# Patient Record
Sex: Male | Born: 1940 | Race: White | Hispanic: No | Marital: Married | Smoking: Former smoker
Health system: Southern US, Community
[De-identification: ages and names within clinical notes are randomized; demographics above are authoritative.]

## PROBLEM LIST (undated history)

## (undated) DIAGNOSIS — I219 Acute myocardial infarction, unspecified: Secondary | ICD-10-CM

## (undated) DIAGNOSIS — E785 Hyperlipidemia, unspecified: Secondary | ICD-10-CM

## (undated) DIAGNOSIS — I2510 Atherosclerotic heart disease of native coronary artery without angina pectoris: Secondary | ICD-10-CM

## (undated) HISTORY — DX: Atherosclerotic heart disease of native coronary artery without angina pectoris: I25.10

## (undated) HISTORY — DX: Acute myocardial infarction, unspecified: I21.9

## (undated) HISTORY — DX: Hyperlipidemia, unspecified: E78.5

---

## 2009-03-29 ENCOUNTER — Inpatient Hospital Stay (HOSPITAL_COMMUNITY): Admission: AD | Admit: 2009-03-29 | Discharge: 2009-04-08 | Payer: Self-pay | Admitting: Cardiovascular Disease

## 2009-03-29 ENCOUNTER — Ambulatory Visit: Payer: Self-pay | Admitting: Cardiology

## 2009-03-30 ENCOUNTER — Encounter: Payer: Self-pay | Admitting: Cardiology

## 2009-03-31 ENCOUNTER — Ambulatory Visit: Payer: Self-pay | Admitting: Surgery

## 2009-03-31 ENCOUNTER — Encounter: Payer: Self-pay | Admitting: Surgery

## 2009-04-01 ENCOUNTER — Encounter: Payer: Self-pay | Admitting: Cardiology

## 2009-04-04 ENCOUNTER — Encounter: Admission: RE | Admit: 2009-04-04 | Discharge: 2009-04-05 | Payer: Self-pay | Admitting: Surgery

## 2009-04-04 HISTORY — PX: CORONARY ARTERY BYPASS GRAFT: SHX141

## 2009-04-20 ENCOUNTER — Ambulatory Visit: Payer: Self-pay | Admitting: Cardiology

## 2009-04-26 ENCOUNTER — Ambulatory Visit: Payer: Self-pay | Admitting: Surgery

## 2009-04-26 ENCOUNTER — Ambulatory Visit: Payer: Self-pay | Admitting: Cardiology

## 2009-04-26 ENCOUNTER — Encounter: Admission: RE | Admit: 2009-04-26 | Discharge: 2009-04-26 | Payer: Self-pay | Admitting: Surgery

## 2009-06-03 ENCOUNTER — Encounter: Payer: Self-pay | Admitting: Cardiology

## 2009-06-03 ENCOUNTER — Ambulatory Visit: Payer: Self-pay | Admitting: Cardiology

## 2009-06-03 DIAGNOSIS — I2510 Atherosclerotic heart disease of native coronary artery without angina pectoris: Secondary | ICD-10-CM | POA: Insufficient documentation

## 2009-06-03 DIAGNOSIS — E782 Mixed hyperlipidemia: Secondary | ICD-10-CM

## 2009-07-28 ENCOUNTER — Ambulatory Visit: Payer: Self-pay | Admitting: Cardiology

## 2009-07-29 ENCOUNTER — Encounter: Payer: Self-pay | Admitting: Cardiology

## 2009-08-16 ENCOUNTER — Encounter: Payer: Self-pay | Admitting: Cardiology

## 2009-08-19 ENCOUNTER — Encounter: Payer: Self-pay | Admitting: Cardiology

## 2009-08-22 ENCOUNTER — Telehealth (INDEPENDENT_AMBULATORY_CARE_PROVIDER_SITE_OTHER): Payer: Self-pay | Admitting: *Deleted

## 2009-08-23 ENCOUNTER — Encounter: Payer: Self-pay | Admitting: Cardiology

## 2009-08-24 ENCOUNTER — Ambulatory Visit: Payer: Self-pay | Admitting: Cardiology

## 2009-08-31 ENCOUNTER — Ambulatory Visit: Payer: Self-pay | Admitting: Cardiology

## 2009-09-16 ENCOUNTER — Encounter: Payer: Self-pay | Admitting: Physician Assistant

## 2009-10-21 ENCOUNTER — Encounter (INDEPENDENT_AMBULATORY_CARE_PROVIDER_SITE_OTHER): Payer: Self-pay | Admitting: *Deleted

## 2009-11-21 ENCOUNTER — Encounter: Payer: Self-pay | Admitting: Cardiology

## 2009-12-01 ENCOUNTER — Ambulatory Visit: Payer: Self-pay | Admitting: Cardiology

## 2009-12-02 ENCOUNTER — Encounter: Payer: Self-pay | Admitting: Cardiology

## 2010-02-14 ENCOUNTER — Encounter (INDEPENDENT_AMBULATORY_CARE_PROVIDER_SITE_OTHER): Payer: Self-pay | Admitting: *Deleted

## 2010-03-15 ENCOUNTER — Ambulatory Visit: Payer: Self-pay | Admitting: Cardiology

## 2010-03-15 DIAGNOSIS — I495 Sick sinus syndrome: Secondary | ICD-10-CM

## 2010-03-24 ENCOUNTER — Encounter: Payer: Self-pay | Admitting: Cardiology

## 2010-03-28 ENCOUNTER — Encounter (INDEPENDENT_AMBULATORY_CARE_PROVIDER_SITE_OTHER): Payer: Self-pay | Admitting: *Deleted

## 2010-06-08 ENCOUNTER — Ambulatory Visit: Payer: Self-pay | Admitting: Cardiology

## 2010-06-29 IMAGING — CR DG CHEST 2V
2 series · 2 of 2 positions shown · non-contrast
Comparison: None.

CLINICAL DATA: 68-year-old male with unstable angina.  Preoperative
study.

CHEST - 2 VIEW

[w chest pa]
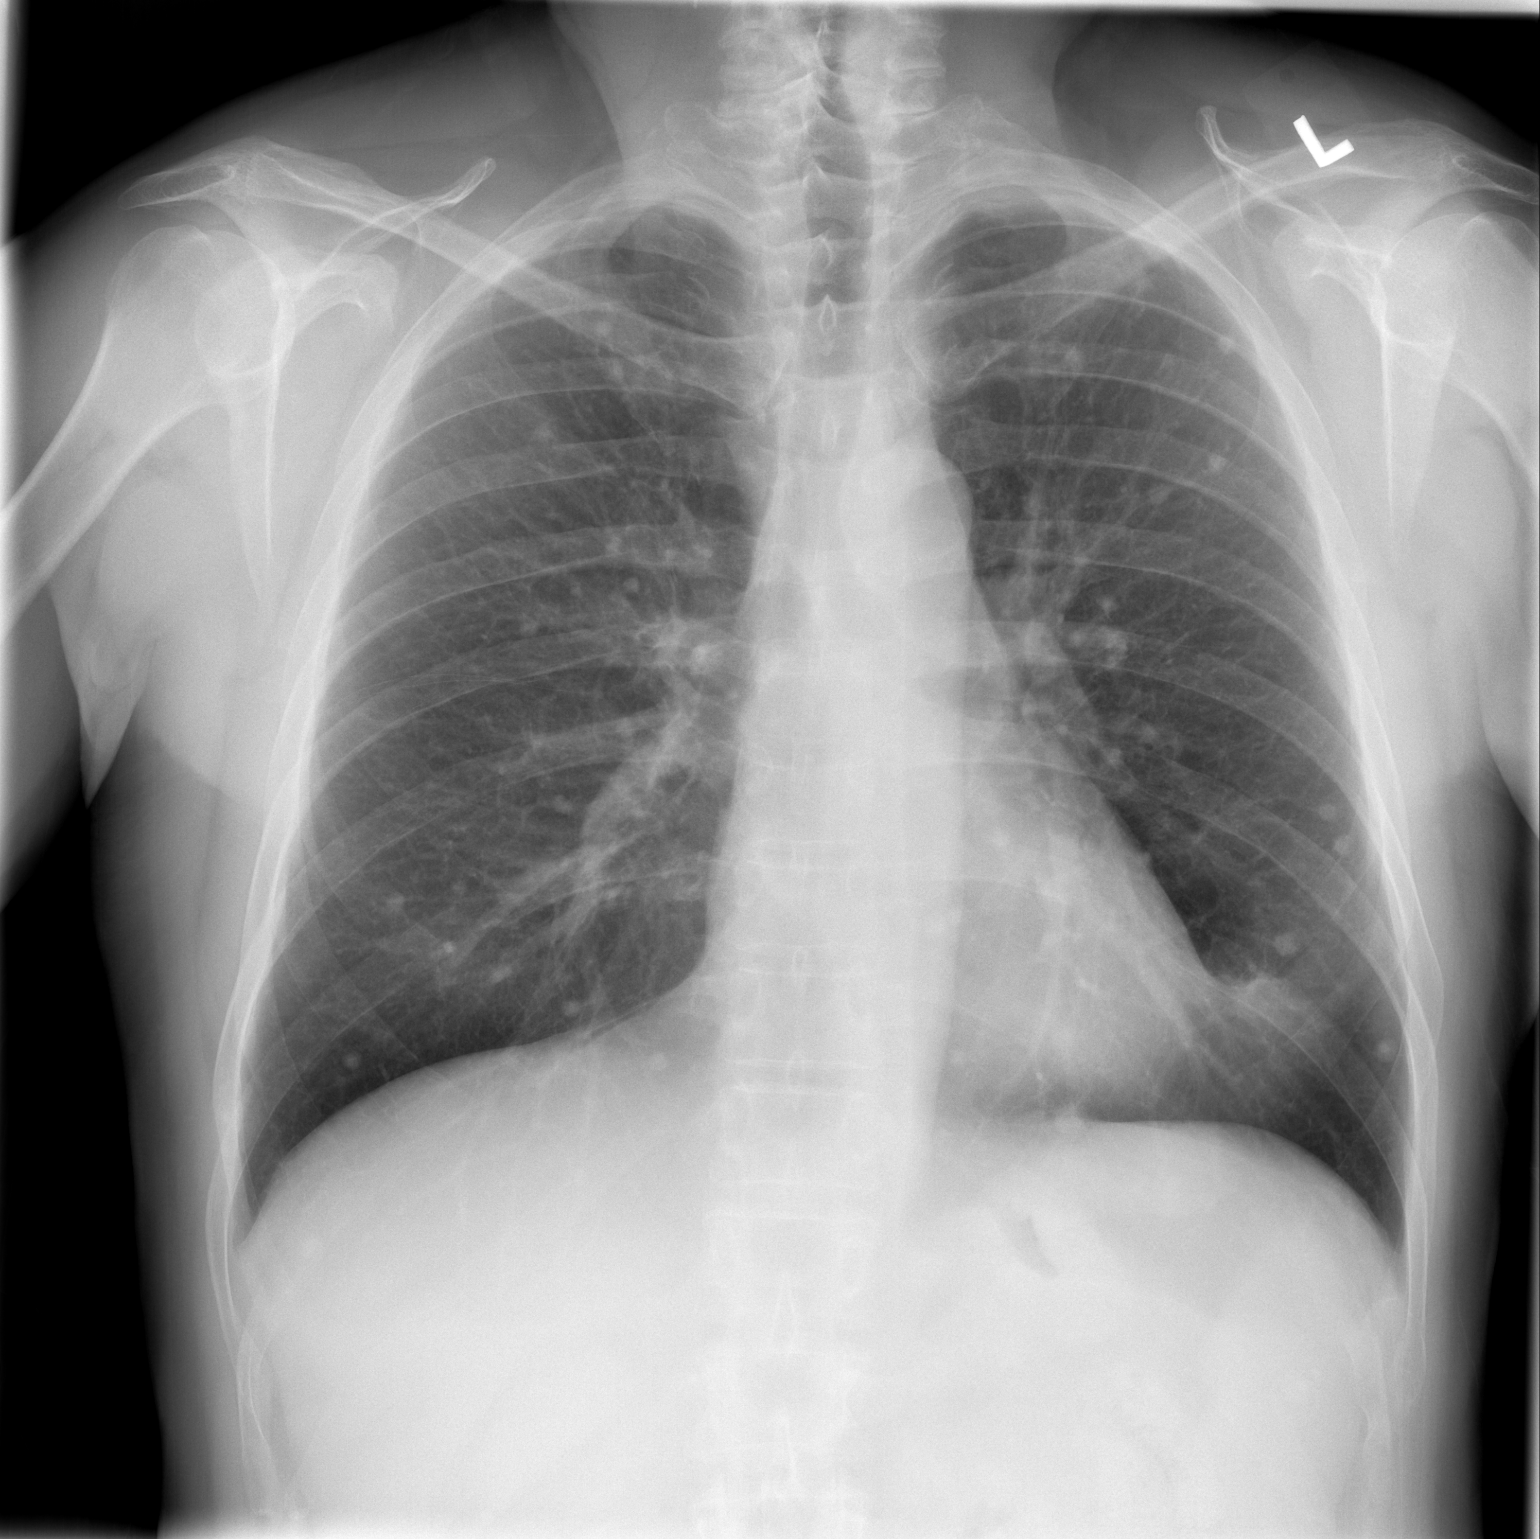

[w chest lat]
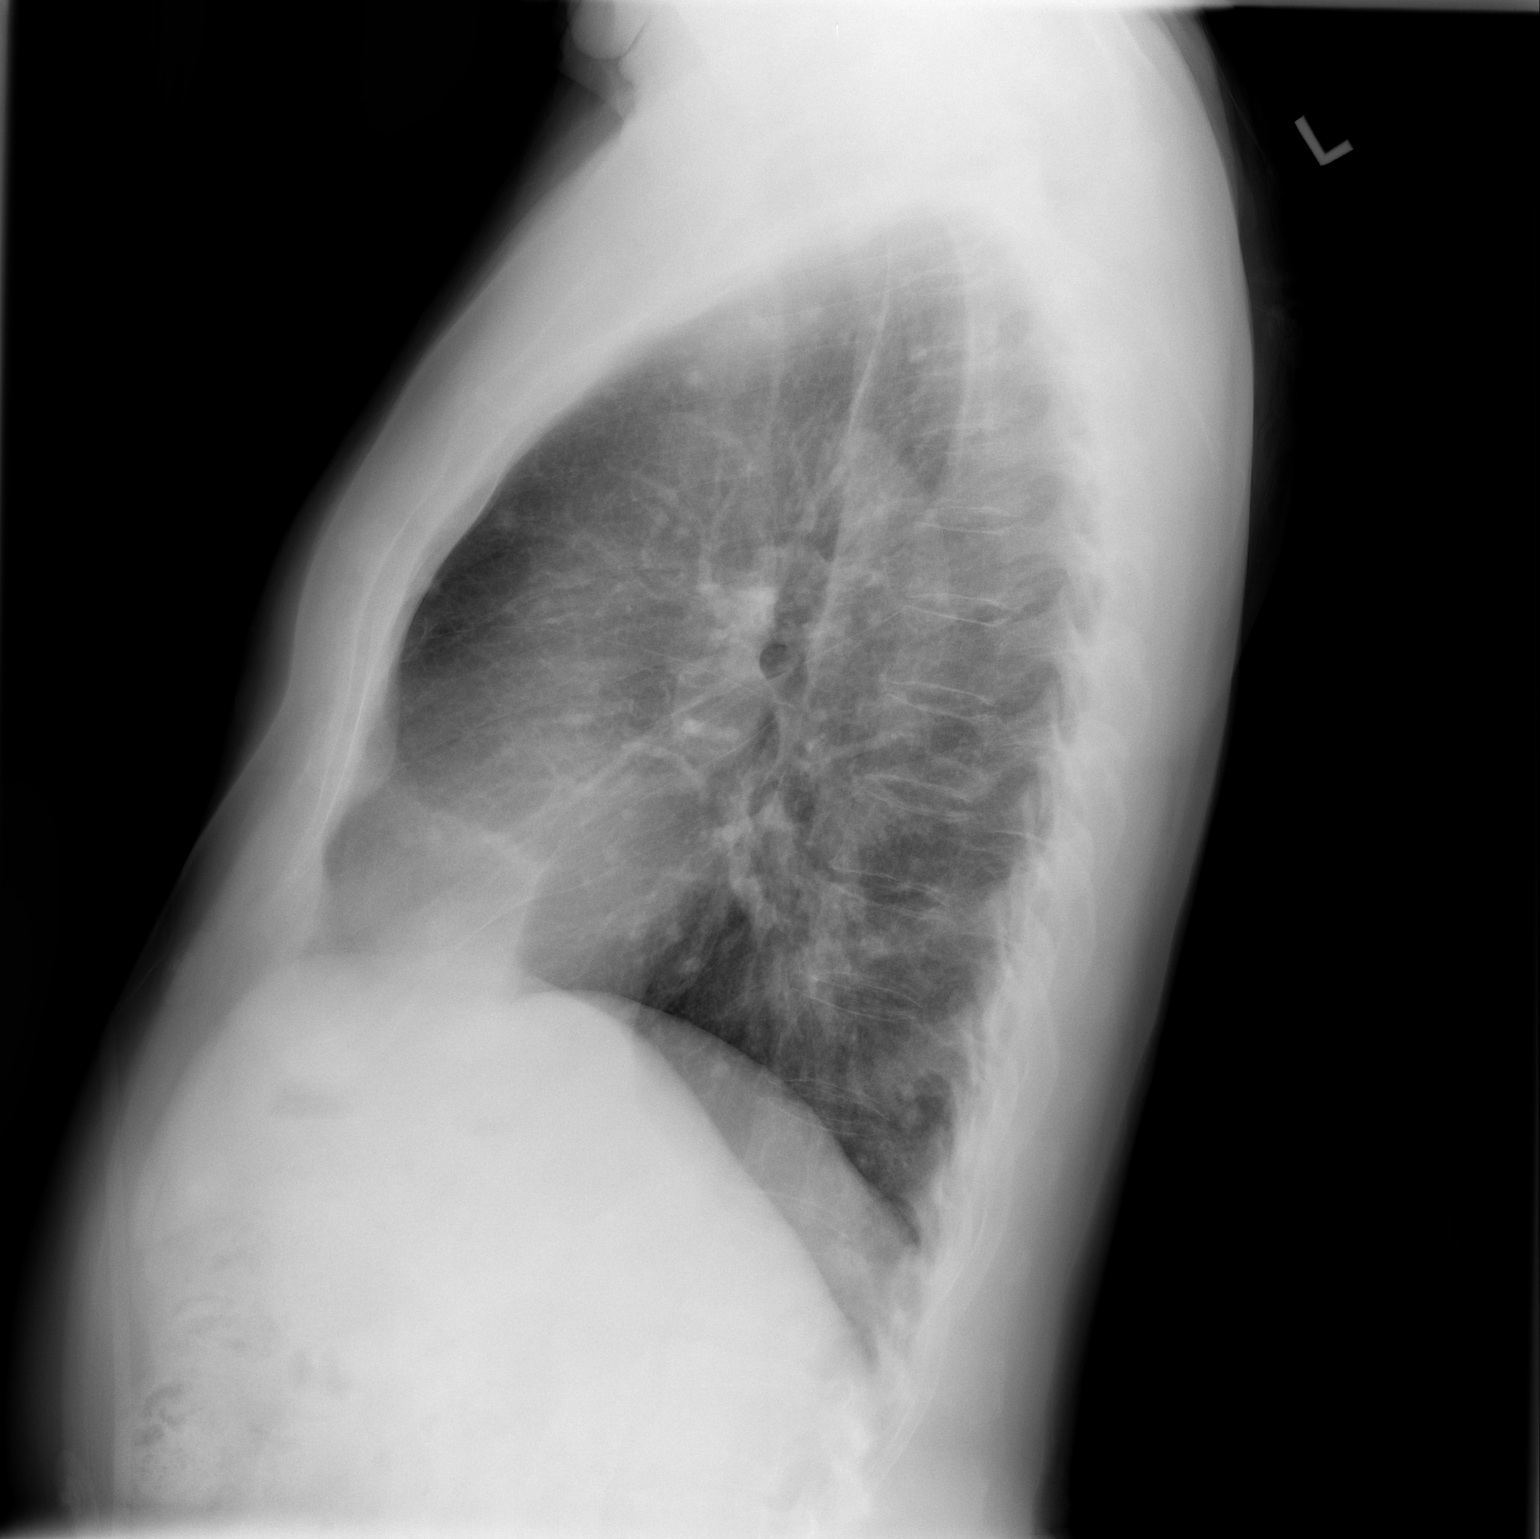

[2 of 2 positions shown; findings below may reference images not displayed]

FINDINGS: Numerous bilateral sub centimeter calcified
granulomas/pulmonary nodules.  Lung volumes are within normal
limits.  Cardiac size and mediastinal contours are within normal
limits.  Visualized tracheal air column is within normal limits.
No pneumothorax, pulmonary edema, pleural effusion, or focal
airspace opacity. No acute osseous abnormality identified.
IMPRESSION: 1.  Numerous bilateral sub centimeter pulmonary nodules all appear
calcified. There are no comparison exams, but this is favored to
reflect the sequelae of disseminated pulmonary granulomatous
infection or varicella pneumonia. Clinical correlation recommended.
2.  Otherwise, no acute cardiopulmonary abnormality.

## 2010-09-26 ENCOUNTER — Ambulatory Visit: Payer: Self-pay | Admitting: Cardiology

## 2010-09-26 ENCOUNTER — Encounter: Payer: Self-pay | Admitting: Cardiology

## 2010-09-26 ENCOUNTER — Telehealth (INDEPENDENT_AMBULATORY_CARE_PROVIDER_SITE_OTHER): Payer: Self-pay | Admitting: *Deleted

## 2010-10-02 ENCOUNTER — Encounter: Payer: Self-pay | Admitting: Cardiology

## 2010-10-04 ENCOUNTER — Encounter: Payer: Self-pay | Admitting: Cardiology

## 2010-10-10 ENCOUNTER — Encounter: Payer: Self-pay | Admitting: Cardiology

## 2010-10-30 ENCOUNTER — Ambulatory Visit: Payer: Self-pay | Admitting: Cardiology

## 2010-10-30 ENCOUNTER — Encounter: Payer: Self-pay | Admitting: Cardiology

## 2010-11-06 ENCOUNTER — Encounter: Payer: Self-pay | Admitting: Cardiology

## 2010-11-23 ENCOUNTER — Encounter: Payer: Self-pay | Admitting: Cardiology

## 2010-11-27 ENCOUNTER — Encounter (INDEPENDENT_AMBULATORY_CARE_PROVIDER_SITE_OTHER): Payer: Self-pay | Admitting: *Deleted

## 2010-12-19 NOTE — Letter (Signed)
Summary: External Correspondence/ OFFICE VISIT DR. Josephina Shih  External Correspondence/ OFFICE VISIT DR. Josephina Shih   Imported By: Dorise Hiss 10/24/2010 15:12:12  _____________________________________________________________________  External Attachment:    Type:   Image     Comment:   External Document

## 2010-12-19 NOTE — Progress Notes (Signed)
Summary: Referral to ENT  Phone Note Other Incoming   Summary of Call: Received voice dictation from Dr. Earnestine Leys that pt is being seen in ER today for trouble swallowing. He wants pt referred to Dr. Andrey Campanile ASAP. Pt also needs 6 month f/u with Dr. Diona Browner that is currently due. Pt will see Dr. Andrey Campanile on Monday, November 14th at 9:45am. He will follow up with Dr. Diona Browner on Monday, Oct 30, 2010 at 1:40pm.   Shanda Bumps in the [facility] ER notified of both appts and states she will notify pt. Initial call taken by: Cyril Loosen, RN, BSN,  September 26, 2010 1:57 PM

## 2010-12-19 NOTE — Assessment & Plan Note (Signed)
Summary: 6 MO FU PER APRIL REMINDER-SRS   Visit Type:  Follow-up Primary Provider:  Dr. Johny Blamer   History of Present Illness: 70 year old male presents for follow-up. He is doing well without any significant angina or progressive breathlessness. He and his wife note that he remains relatively bradycardic, and he does wonder if this presents some functional limitation, even on very low-dose beta blocker therapy. He notes that he has been forgetting his evening Zocor dose, not infrequently.  Labs from October 2010 show total cholesterol 164, triglycerides 140, HDL 34, and LDL 102.  AST 24 and ALT 29. Followup from 14 January revealed total cholesterol 165, triglycerides 75, HDL 32, and LDL 118. AST 23 and ALT 30.  Preventive Screening-Counseling & Management  Alcohol-Tobacco     Smoking Status: quit     Year Started: 40  yrs     Year Quit: 03/2009  Current Medications (verified): 1)  Simvastatin 20 Mg Tabs (Simvastatin) .... Take One Tablet By Mouth Daily At Bedtime 2)  Nitroglycerin 0.4 Mg Subl (Nitroglycerin) .... Place One Tablet Under Tongue Every 5 Minutes For Severe Chest Pain, Not To Exceed 3 in 15 Min Time Frame 3)  Metoprolol Succinate 25 Mg Xr24h-Tab (Metoprolol Succinate) .... Take 1/2 Tab Daily 4)  Aspirin 81 Mg Tbec (Aspirin) .... Take One Tablet By Mouth Daily 5)  Gabapentin 300 Mg Caps (Gabapentin) .... As Needed  Allergies (verified): No Known Drug Allergies  Comments:  Nurse/Medical Assistant: Did not bring bottles.   The patient's medications were reviewed with the patient and were updated in the Medication List.  Past History:  Social History: Last updated: 03/15/2010 Retired  Married  Alcohol Use - no Tobacco Use - Former  Past Medical History: CAD - LAD disease, otherwise nonobstructive, LVEF normal Myocardial Infarction - NSTEMI 5/10 Herpes Zoster Hyperlipidemia  Past Surgical History: CABG  04/04/09  -  Evelene Croon, MD - off pump LIMA to  LAD  Family History: Family History of Coronary Artery Disease  Social History: Retired  Married  Alcohol Use - no Tobacco Use - Former  Review of Systems       The patient complains of dyspnea on exertion.  The patient denies anorexia, fever, chest pain, syncope, prolonged cough, headaches, hemoptysis, abdominal pain, melena, and hematochezia.         Otherwise reviewed and negative.  Vital Signs:  Patient profile:   70 year old male Height:      67 inches Weight:      174.50 pounds BMI:     27.43 O2 Sat:      97 % on Room air Pulse rate:   51 / minute BP sitting:   128 / 77  (left arm) Cuff size:   regular  Vitals Entered By: Hoover Brunette, LPN (March 15, 2010 1:38 PM)  Nutrition Counseling: Patient's BMI is greater than 25 and therefore counseled on weight management options.  O2 Flow:  Room air Is Patient Diabetic? No Comments routine follow up   Physical Exam  Additional Exam:  Comfortable in no acute distress. HEENT: Conjuctivae and lids normal, oropharynx clear with moist mucosa. Neck: Supple, no elevated JVP, no loud carotid bruits, no thyromegaly or tenderness. Lungs: Nonlabored breathing at rest. CTA without rales or wheezes. Thorax: Well-healed sternal incision. Chest wall is stable. Cor: PMI nondisplaced. RRR, normal S1/S2. No pathologic systolic murmurs. No S3 or rub. Ext: No CCE. Distal pulses 2+. Skin: Warm and dry. Musculoskeletal: No gross deformities. Neuropsychiatric:  Alert and oriented x3, affect appropriate.    EKG  Procedure date:  03/15/2010  Findings:      Sinus bradycardia at 50 beats per minute.  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 No active angina. 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 Electrocardiogram is stable, otherwise with sinus bradycardia. Follow up will be arranged in 6 months.  The  following medications were removed from the medication list:    Metoprolol Succinate 25 Mg Xr24h-tab (Metoprolol succinate) .Marland Kitchen... Take 1/2 tab daily His updated medication list for this problem includes:    Nitroglycerin 0.4 Mg Subl (Nitroglycerin) .Marland Kitchen... Place one tablet under tongue every 5 minutes for severe chest pain, not to exceed 3 in 15 min time frame    Aspirin 81 Mg Tbec (Aspirin) .Marland Kitchen... Take one tablet by mouth daily  Orders: EKG w/ Interpretation (93000)  Problem # 2:  MIXED HYPERLIPIDEMIA (ICD-272.2)  Followup fasting lipid profile and liver function tests. I asked him to start taking his Zocor in the morning with his other medicines, so he will hopefully not forget to take it.  His updated medication list for this problem includes:    Simvastatin 20 Mg Tabs (Simvastatin) .Marland Kitchen... 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 I asked him to stop his low-dose Toprol-XL to see if this leads to any improvement.  The following medications were removed from the medication list:    Metoprolol Succinate 25 Mg Xr24h-tab (Metoprolol succinate) .Marland Kitchen... Take 1/2 tab daily His updated medication list for this problem includes:    Nitroglycerin 0.4 Mg Subl (Nitroglycerin) .Marland Kitchen... Place one tablet under tongue every 5 minutes for severe chest pain, not to exceed 3 in 15 min time frame    Aspirin 81 Mg Tbec (Aspirin) .Marland Kitchen... Take one tablet by mouth daily  Patient Instructions: 1)  Your physician wants you to follow-up in: 6 months. You will receive a reminder letter in the mail one-two months in advance. If you don't receive a letter, please call our office to schedule the follow-up appointment. 2)  STOP TOPROL XL (metoprolol). 3)  Your physician recommends that you go to the [facility] for a FASTING lipid profile and liver function  labs. Do not eat or drink after midnight. 4)  You may take your Zocor (simvastatin) in the morning if this will help you remember to take this medication.

## 2010-12-19 NOTE — Miscellaneous (Signed)
  Clinical Lists Changes  Observations: Added new observation of RS STUDY: TRACER - study completion 12/01/09  (02/14/2010 12:16)      Research Study Name: TRACER - study completion 12/01/09

## 2010-12-19 NOTE — Letter (Signed)
Summary: External Correspondence/ OFFICE VISIT DR. Josephina Shih  External Correspondence/ OFFICE VISIT DR. Josephina Shih   Imported By: Dorise Hiss 10/24/2010 15:10:52  _____________________________________________________________________  External Attachment:    Type:   Image     Comment:   External Document

## 2010-12-19 NOTE — Progress Notes (Signed)
Summary: Surgical Clearance-Surgery 08/23/09  Phone Note From Other Clinic   Summary of Call: Pt saw Dr. Jerre Simon today and was scheduled for surgery tomorrow. They would like clearance, if possible, so that surgery can be done tomorrow. Surgery d/t kidney stones. Spoke with Elease Hashimoto at [phone]. Initial call taken by: Cyril Loosen, RN, BSN,  August 22, 2009 2:34 PM  Follow-up for Phone Call        Last saw patient in June following NSTEMI and CABG in May.  What kind of surgery and anesthesia are planned?  If we can see him at hospital for consultation before surgery, this might be best way to proceed. Follow-up by: Loreli Slot, MD, [facility],  August 22, 2009 2:45 PM  Additional Follow-up for Phone Call Additional follow up Details #1::        He is scheduled for cystoscopy, retrograde polygram, ureteroscopic stone basket extraction with choice anesthesia. Surgery scheduled for tomorrow morning at [facility] at 8:15. Pt is to arrive at 6:15. Additional Follow-up by: Cyril Loosen, RN, BSN,  August 22, 2009 3:13 PM    Additional Follow-up for Phone Call Additional follow up Details #2::    Given the last minute request for pre-op evaluation, I would suggest that surgery be done later in the morning in order to allow time for a brief cardiology consultation.  A phone "clearance" is unwise since he is recently s/p CABG and has not been seen since June. Follow-up by: Loreli Slot, MD, [facility],  August 22, 2009 3:24 PM  Additional Follow-up for Phone Call Additional follow up Details #3:: Details for Additional Follow-up Action Taken: They are going to reschedule this until Wednesday at 1:40 at [facility]. They will have pt arrive early to [facility] for consult before surgery. Additional Follow-up by: Cyril Loosen, RN, BSN,  August 22, 2009 4:39 PM

## 2010-12-19 NOTE — Letter (Signed)
Summary: Engineer, materials at [facility]  [address]   [place], [place] [address]   Phone: [phone]  Fax: [phone]        Mar 28, 2010 MRN: [id number]    Dalton Phillips [address] [place], [place]  [address]    Dear Mr. KADING,  Your test ordered by Selena Batten has been reviewed by your physician (or physician assistant) and was found to be normal or stable. Your physician (or physician assistant) felt no changes were needed at this time.  ____ Echocardiogram  ____ Cardiac Stress Test  __X__ Lab Work-LDL (bad cholesterol) continues to improve (98 on this report), liver function also good. Continue same medication.  ____ Peripheral vascular study of arms, legs or neck  ____ CT scan or X-ray  ____ Lung or Breathing test  ____ Other:   Thank you.   Cyril Loosen, RN, BSN    Duane Boston, M.D., F.A.C.C. Thressa Sheller, M.D., F.A.C.C. Oneal Grout, M.D., F.A.C.C. Cheree Ditto, M.D., F.A.C.C. Daiva Nakayama, M.D., F.A.C.C. Kenney Houseman, M.D., F.A.C.C. Jeanne Ivan, PA-C

## 2010-12-19 NOTE — Miscellaneous (Signed)
Summary: Orders Update  Clinical Lists Changes  Orders: Added new Test order of T-Lipid Profile ([id number]) - Signed Added new Test order of T-Hepatic Function ([id number]) - Signed 

## 2010-12-21 NOTE — Assessment & Plan Note (Signed)
Summary: 6 MO F/U PER REMINDER-PT IN ER 11/8-JM   Visit Type:  Follow-up Primary Provider:  Dr. Johny Blamer   History of Present Illness: 70 year old male presents for followup. He was seen back in April of this year.  Recently referred for ENT evaluation with Dr. Andrey Campanile in November related to problems swallowing. Esophagram was reviewed at that time. Patient's symptoms had improved.  Followup labs from November showed BUN 14, creatinine 1.1, sodium 136, potassium 4.3, total CK 143, troponin I less than 0.01, BNP 36, hemoglobin 13.7, platelet count 178. Previous lipid studies from May showed total cholesterol 161, HDL 44, LDL 98, triglycerides 96, with normal liver function tests.  He is trying to establish primary care here in [place].  Reports no problems with angina or progressive shortness of breath. Continues to like to work outside, plays golf 3 days a week.   Preventive Screening-Counseling & Management  Alcohol-Tobacco     Smoking Status: quit     Year Quit: 03/2009  Current Medications (verified): 1)  Simvastatin 20 Mg Tabs (Simvastatin) .... Take One Tablet By Mouth Daily At Bedtime 2)  Nitroglycerin 0.4 Mg Subl (Nitroglycerin) .... Place One Tablet Under Tongue Every 5 Minutes For Severe Chest Pain, Not To Exceed 3 in 15 Min Time Frame 3)  Aspirin 81 Mg Tbec (Aspirin) .... Take One Tablet By Mouth Daily  Allergies (verified): No Known Drug Allergies  Comments:  Nurse/Medical Assistant: The patient's medications and allergies were verbally reviewed with the patient and were updated in the Medication and Allergy Lists.  Past History:  Past Medical History: Last updated: 03/15/2010 CAD - LAD disease, otherwise nonobstructive, LVEF normal Myocardial Infarction - NSTEMI 5/10 Herpes Zoster Hyperlipidemia  Past Surgical History: Last updated: 03/15/2010 CABG  04/04/09  -  Evelene Croon, MD - off pump LIMA to LAD  Social History: Last updated: 03/15/2010 Retired   Married  Alcohol Use - no Tobacco Use - Former  Review of Systems  The patient denies anorexia, fever, weight loss, chest pain, syncope, dyspnea on exertion, peripheral edema, melena, and hematochezia.         Otherwise reviewed and negative.  Vital Signs:  Patient profile:   70 year old male Height:      67 inches Weight:      169 pounds Pulse rate:   68 / minute BP sitting:   123 / 86  (left arm) Cuff size:   regular  Vitals Entered By: Carlye Grippe (October 30, 2010 1:51 PM)  Physical Exam  Additional Exam:  Comfortable in no acute distress. HEENT: Conjuctivae and lids normal, oropharynx clear with moist mucosa. Neck: Supple, no elevated JVP, no loud carotid bruits, no thyromegaly or tenderness. Lungs: Nonlabored breathing at rest. CTA without rales or wheezes. Thorax: Well-healed sternal incision. Chest wall is stable. Cor: PMI nondisplaced. RRR, normal S1/S2. No pathologic systolic murmurs. No S3 or rub. Ext: No CCE. Distal pulses 2+. Skin: Warm and dry. Musculoskeletal: No gross deformities. Neuropsychiatric: Alert and oriented x3, affect appropriate.    EKG  Procedure date:  10/30/2010  Findings:      Sinus rhythm at 61 beats per minute.  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 Encouraged regular activity and exercise. Follow up in 6 months.  His updated medication list for this problem includes:    Nitroglycerin 0.4 Mg Subl (Nitroglycerin) .Marland Kitchen... Place one tablet under tongue every 5 minutes for  severe chest pain, not to exceed 3 in 15 min time frame    Aspirin 81 Mg Tbec (Aspirin) .Marland Kitchen... Take one tablet by mouth daily  Orders: EKG w/ Interpretation (93000)  Problem # 2:  MIXED HYPERLIPIDEMIA (ICD-272.2)  Continues on simvastatin. Patient is to establish with primary care provider here in [place] soon. Can have followup liver function and  lipids at that time.  His updated medication list for this problem includes:    Simvastatin 20 Mg Tabs (Simvastatin) .Marland Kitchen... Take one tablet by mouth daily at bedtime  Patient Instructions: 1)  Your physician wants you to follow-up in: 6 months. You will receive a reminder letter in the mail one-two months in advance. If you don't receive a letter, please call our office to schedule the follow-up appointment. 2)  Your physician recommends that you continue on your current medications as directed. Please refer to the Current Medication list given to you today.

## 2010-12-21 NOTE — Medication Information (Signed)
Summary: RX Folder/ INCREASE SIMVASTATIN  RX Folder/ INCREASE SIMVASTATIN   Imported By: Dorise Hiss 11/28/2010 16:45:45  _____________________________________________________________________  External Attachment:    Type:   Image     Comment:   External Document

## 2010-12-21 NOTE — Miscellaneous (Signed)
Summary: SIMVASTATIN DOSE INCREASED TO 40MG   Clinical Lists Changes  Medications: Changed medication from SIMVASTATIN 20 MG TABS (SIMVASTATIN) Take one tablet by mouth daily at bedtime to SIMVASTATIN 40 MG TABS (SIMVASTATIN) Take 1 tablet by mouth once a day

## 2011-01-31 ENCOUNTER — Encounter: Payer: Self-pay | Admitting: Cardiology

## 2011-02-05 ENCOUNTER — Encounter: Payer: Self-pay | Admitting: Cardiology

## 2011-02-06 ENCOUNTER — Telehealth: Payer: Self-pay | Admitting: *Deleted

## 2011-02-06 DIAGNOSIS — E782 Mixed hyperlipidemia: Secondary | ICD-10-CM

## 2011-02-06 NOTE — Telephone Encounter (Signed)
Error-incorrect patient

## 2011-02-06 NOTE — Telephone Encounter (Deleted)
Pt's wife returned call in reference to pt's lab results. 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 He wanted her to call us for results. Pt's wife notified of results to decrease Crestor to 5mg every night (start new prescription) and repeat cholesterol labs in 8 weeks. Pt's wife verbalized understanding. 

## 2011-02-06 NOTE — Letter (Addendum)
Summary: Letter/  [facility]ED [facility]  Letter/  FAXED [facility]   Imported By: Dorise Hiss 01/31/2011 08:56:37  _____________________________________________________________________  External Attachment:    Type:   Image     Comment:   External Document  Appended Document: Letter/  FAXED [facility] DENT[facility] Dentist from [facility] called back stating she needs to know specifically if it is okay to be on Pilocarpine b/c there are multiple cardiac side effects to this. She would like to know if what possible changes in drug therapy could be made for optimal oral care.   Requested that their office fax a cleaner copy of request as this copy is difficult to read. She is aware this will be given to Dr. Diona Browner for his review when he returns next week.  Appended Document: Letter/  FAXED [facility] Reviewed. Based on my most recent note, he is only taking ASA, as needed NTG, and simvastatin - none of which would be typically implicated in causing dry mouth. Which medication is he concerned about? We could discuss other options if within reason. Pilocarpine is not absolutely contraindicated in CAD, but there are increased cardiac risks in general  The prescriber should certainly review the potential side effects with the patient and make sure that he understands the potential risks.  Appended Document: Letter/  FAXED [facility] DENT[facility]ARE Faxed

## 2011-02-15 NOTE — Letter (Signed)
Summary: Letter/  [facility]ED [facility]  Letter/  FAXED [facility]   Imported By: Dorise Hiss 02/05/2011 17:02:01  _____________________________________________________________________  External Attachment:    Type:   Image     Comment:   External Document

## 2011-02-27 LAB — URINALYSIS, ROUTINE W REFLEX MICROSCOPIC
Bilirubin Urine: NEGATIVE
Bilirubin Urine: NEGATIVE
Glucose, UA: NEGATIVE mg/dL
Glucose, UA: NEGATIVE mg/dL
Hgb urine dipstick: NEGATIVE
Ketones, ur: NEGATIVE mg/dL
Ketones, ur: NEGATIVE mg/dL
Nitrite: NEGATIVE
Protein, ur: NEGATIVE mg/dL
pH: 6 (ref 5.0–8.0)

## 2011-02-27 LAB — LIPID PANEL
Cholesterol: 138 mg/dL (ref 0–200)
LDL Cholesterol: 115 mg/dL — ABNORMAL HIGH (ref 0–99)
Total CHOL/HDL Ratio: 4.5 RATIO
Total CHOL/HDL Ratio: 6.3 RATIO
VLDL: 27 mg/dL (ref 0–40)

## 2011-02-27 LAB — BASIC METABOLIC PANEL
BUN: 11 mg/dL (ref 6–23)
BUN: 11 mg/dL (ref 6–23)
BUN: 9 mg/dL (ref 6–23)
BUN: 11 mg/dL (ref 6–23)
BUN: 11 mg/dL (ref 6–23)
BUN: 12 mg/dL (ref 6–23)
CO2: 26 mEq/L (ref 19–32)
CO2: 27 mEq/L (ref 19–32)
Calcium: 8.6 mg/dL (ref 8.4–10.5)
Calcium: 8.6 mg/dL (ref 8.4–10.5)
Calcium: 9.6 mg/dL (ref 8.4–10.5)
Calcium: 9.4 mg/dL (ref 8.4–10.5)
Chloride: 106 mEq/L (ref 96–112)
Chloride: 101 mEq/L (ref 96–112)
Creatinine, Ser: 1.02 mg/dL (ref 0.4–1.5)
Creatinine, Ser: 1.04 mg/dL (ref 0.4–1.5)
Creatinine, Ser: 1.07 mg/dL (ref 0.4–1.5)
Creatinine, Ser: 1.09 mg/dL (ref 0.4–1.5)
GFR calc Af Amer: >60 mL/min (ref >60)
GFR calc Af Amer: >60 mL/min (ref >60)
GFR calc non Af Amer: >60 mL/min (ref >60)
GFR calc non Af Amer: >60 mL/min (ref >60)
GFR calc non Af Amer: >60 mL/min (ref >60)
GFR calc non Af Amer: >60 mL/min (ref >60)
GFR calc non Af Amer: >60 mL/min (ref >60)
GFR calc non Af Amer: >60 mL/min (ref >60)
GFR calc non Af Amer: >60 mL/min (ref >60)
Glucose, Bld: 110 mg/dL — ABNORMAL HIGH (ref 70–99)
Glucose, Bld: 111 mg/dL — ABNORMAL HIGH (ref 70–99)
Glucose, Bld: 119 mg/dL — ABNORMAL HIGH (ref 70–99)
Glucose, Bld: 123 mg/dL — ABNORMAL HIGH (ref 70–99)
Glucose, Bld: 125 mg/dL — ABNORMAL HIGH (ref 70–99)
Potassium: 4.3 mEq/L (ref 3.5–5.1)
Potassium: 3.6 mEq/L (ref 3.5–5.1)
Potassium: 4.1 mEq/L (ref 3.5–5.1)
Potassium: 4.3 mEq/L (ref 3.5–5.1)
Potassium: 4.4 mEq/L (ref 3.5–5.1)
Sodium: 134 mEq/L — ABNORMAL LOW (ref 135–145)
Sodium: 140 mEq/L (ref 135–145)
Sodium: 140 mEq/L (ref 135–145)

## 2011-02-27 LAB — POCT I-STAT, CHEM 8
BUN: 11 mg/dL (ref 6–23)
Chloride: 104 mEq/L (ref 96–112)
Creatinine, Ser: 0.9 mg/dL (ref 0.4–1.5)
HCT: 31 % — ABNORMAL LOW (ref 39.0–52.0)
Hemoglobin: 10.5 g/dL — ABNORMAL LOW (ref 13.0–17.0)
Potassium: 4 mEq/L (ref 3.5–5.1)
Sodium: 135 mEq/L (ref 135–145)
Sodium: 139 mEq/L (ref 135–145)

## 2011-02-27 LAB — CBC
HCT: 26.7 % — ABNORMAL LOW (ref 39.0–52.0)
HCT: 28.5 % — ABNORMAL LOW (ref 39.0–52.0)
HCT: 31.5 % — ABNORMAL LOW (ref 39.0–52.0)
HCT: 35.6 % — ABNORMAL LOW (ref 39.0–52.0)
HCT: 35 % — ABNORMAL LOW (ref 39.0–52.0)
HCT: 36.9 % — ABNORMAL LOW (ref 39.0–52.0)
Hemoglobin: 10 g/dL — ABNORMAL LOW (ref 13.0–17.0)
Hemoglobin: 10.4 g/dL — ABNORMAL LOW (ref 13.0–17.0)
Hemoglobin: 11.1 g/dL — ABNORMAL LOW (ref 13.0–17.0)
Hemoglobin: 9.4 g/dL — ABNORMAL LOW (ref 13.0–17.0)
Hemoglobin: 12.3 g/dL — ABNORMAL LOW (ref 13.0–17.0)
Hemoglobin: 12.4 g/dL — ABNORMAL LOW (ref 13.0–17.0)
Hemoglobin: 12.9 g/dL — ABNORMAL LOW (ref 13.0–17.0)
MCHC: 35.2 g/dL (ref 30.0–36.0)
MCHC: 35.2 g/dL (ref 30.0–36.0)
MCHC: 35.2 g/dL (ref 30.0–36.0)
MCHC: 35.3 g/dL (ref 30.0–36.0)
MCHC: 35.1 g/dL (ref 30.0–36.0)
MCHC: 35.3 g/dL (ref 30.0–36.0)
MCV: 119.5 fL — ABNORMAL HIGH (ref 78.0–100.0)
MCV: 119.9 fL — ABNORMAL HIGH (ref 78.0–100.0)
MCV: 120.9 fL — ABNORMAL HIGH (ref 78.0–100.0)
MCV: 120.8 fL — ABNORMAL HIGH (ref 78.0–100.0)
Platelets: 118 10*3/uL — ABNORMAL LOW (ref 150–400)
Platelets: 132 10*3/uL — ABNORMAL LOW (ref 150–400)
Platelets: 149 10*3/uL — ABNORMAL LOW (ref 150–400)
Platelets: 178 10*3/uL (ref 150–400)
Platelets: 133 10*3/uL — ABNORMAL LOW (ref 150–400)
Platelets: 133 10*3/uL — ABNORMAL LOW (ref 150–400)
Platelets: 140 10*3/uL — ABNORMAL LOW (ref 150–400)
RBC: 2.3 MIL/uL — ABNORMAL LOW (ref 4.22–5.81)
RBC: 2.35 MIL/uL — ABNORMAL LOW (ref 4.22–5.81)
RBC: 2.62 MIL/uL — ABNORMAL LOW (ref 4.22–5.81)
RBC: 2.9 MIL/uL — ABNORMAL LOW (ref 4.22–5.81)
RBC: 2.95 MIL/uL — ABNORMAL LOW (ref 4.22–5.81)
RDW: 14.6 % (ref 11.5–15.5)
RDW: 14.7 % (ref 11.5–15.5)
RDW: 14.8 % (ref 11.5–15.5)
RDW: 15.1 % (ref 11.5–15.5)
RDW: 14.5 % (ref 11.5–15.5)
RDW: 15 % (ref 11.5–15.5)
WBC: 10.3 10*3/uL (ref 4.0–10.5)
WBC: 11.4 10*3/uL — ABNORMAL HIGH (ref 4.0–10.5)
WBC: 7.8 10*3/uL (ref 4.0–10.5)
WBC: 8.8 10*3/uL (ref 4.0–10.5)
WBC: 6.3 10*3/uL (ref 4.0–10.5)

## 2011-02-27 LAB — DIFFERENTIAL
Basophils Absolute: 0 10*3/uL (ref 0.0–0.1)
Basophils Absolute: 0 10*3/uL (ref 0.0–0.1)
Basophils Absolute: 0 10*3/uL (ref 0.0–0.1)
Basophils Absolute: 0 10*3/uL (ref 0.0–0.1)
Basophils Relative: 1 % (ref 0–1)
Eosinophils Absolute: 0.3 10*3/uL (ref 0.0–0.7)
Eosinophils Absolute: 0.3 10*3/uL (ref 0.0–0.7)
Eosinophils Relative: 3 % (ref 0–5)
Eosinophils Relative: 4 % (ref 0–5)
Eosinophils Relative: 4 % (ref 0–5)
Eosinophils Relative: 4 % (ref 0–5)
Lymphocytes Relative: 30 % (ref 12–46)
Lymphocytes Relative: 35 % (ref 12–46)
Lymphocytes Relative: 35 % (ref 12–46)
Lymphocytes Relative: 38 % (ref 12–46)
Lymphs Abs: 3 10*3/uL (ref 0.7–4.0)
Lymphs Abs: 3.3 10*3/uL (ref 0.7–4.0)
Lymphs Abs: 2.4 10*3/uL (ref 0.7–4.0)
Monocytes Absolute: 0.6 10*3/uL (ref 0.1–1.0)
Monocytes Relative: 8 % (ref 3–12)
Neutro Abs: 6.9 10*3/uL (ref 1.7–7.7)
Neutro Abs: 3.1 10*3/uL (ref 1.7–7.7)
Neutro Abs: 3.2 10*3/uL (ref 1.7–7.7)
Neutrophils Relative %: 49 % (ref 43–77)
Neutrophils Relative %: 60 % (ref 43–77)

## 2011-02-27 LAB — CARDIAC PANEL(CRET KIN+CKTOT+MB+TROPI)
CK, MB: 3.3 ng/mL (ref 0.3–4.0)
CK, MB: 2.9 ng/mL (ref 0.3–4.0)
CK, MB: 3.1 ng/mL (ref 0.3–4.0)
CK, MB: 3.3 ng/mL (ref 0.3–4.0)
CK, MB: 3.7 ng/mL (ref 0.3–4.0)
Relative Index: 1.4 (ref 0.0–2.5)
Relative Index: 1.6 (ref 0.0–2.5)
Total CK: 100 U/L (ref 7–232)
Total CK: 210 U/L (ref 7–232)
Total CK: 78 U/L (ref 7–232)
Troponin I: 0.23 ng/mL — ABNORMAL HIGH (ref 0.00–0.06)
Troponin I: 0.27 ng/mL — ABNORMAL HIGH (ref 0.00–0.06)
Troponin I: 0.21 ng/mL — ABNORMAL HIGH (ref 0.00–0.06)
Troponin I: 0.43 ng/mL — ABNORMAL HIGH (ref 0.00–0.06)
Troponin I: 0.44 ng/mL — ABNORMAL HIGH (ref 0.00–0.06)
Troponin I: 0.71 ng/mL (ref 0.00–0.06)

## 2011-02-27 LAB — MAGNESIUM
Magnesium: 2.4 mg/dL (ref 1.5–2.5)
Magnesium: 2.7 mg/dL — ABNORMAL HIGH (ref 1.5–2.5)

## 2011-02-27 LAB — HEPARIN LEVEL (UNFRACTIONATED)
Heparin Unfractionated: 0.28 IU/mL — ABNORMAL LOW (ref 0.30–0.70)
Heparin Unfractionated: 0.36 IU/mL (ref 0.30–0.70)
Heparin Unfractionated: <0.1 IU/mL — ABNORMAL LOW (ref 0.30–0.70)
Heparin Unfractionated: <0.1 IU/mL — ABNORMAL LOW (ref 0.30–0.70)
Heparin Unfractionated: 0.22 IU/mL — ABNORMAL LOW (ref 0.30–0.70)
Heparin Unfractionated: 0.34 IU/mL (ref 0.30–0.70)
Heparin Unfractionated: 0.44 IU/mL (ref 0.30–0.70)

## 2011-02-27 LAB — GLUCOSE, CAPILLARY
Glucose-Capillary: 107 mg/dL — ABNORMAL HIGH (ref 70–99)
Glucose-Capillary: 108 mg/dL — ABNORMAL HIGH (ref 70–99)
Glucose-Capillary: 110 mg/dL — ABNORMAL HIGH (ref 70–99)
Glucose-Capillary: 113 mg/dL — ABNORMAL HIGH (ref 70–99)
Glucose-Capillary: 124 mg/dL — ABNORMAL HIGH (ref 70–99)
Glucose-Capillary: 136 mg/dL — ABNORMAL HIGH (ref 70–99)
Glucose-Capillary: 138 mg/dL — ABNORMAL HIGH (ref 70–99)
Glucose-Capillary: 95 mg/dL (ref 70–99)
Glucose-Capillary: 129 mg/dL — ABNORMAL HIGH (ref 70–99)

## 2011-02-27 LAB — HEMOGLOBIN A1C
Hgb A1c MFr Bld: 5.5 % (ref 4.6–6.1)
Mean Plasma Glucose: 105 mg/dL

## 2011-02-27 LAB — POCT I-STAT 4, (NA,K, GLUC, HGB,HCT)
Glucose, Bld: 107 mg/dL — ABNORMAL HIGH (ref 70–99)
Glucose, Bld: 125 mg/dL — ABNORMAL HIGH (ref 70–99)
HCT: 24 % — ABNORMAL LOW (ref 39.0–52.0)
HCT: 33 % — ABNORMAL LOW (ref 39.0–52.0)
Hemoglobin: 11.2 g/dL — ABNORMAL LOW (ref 13.0–17.0)
Hemoglobin: 8.2 g/dL — ABNORMAL LOW (ref 13.0–17.0)
Potassium: 4 mEq/L (ref 3.5–5.1)
Potassium: 4.4 mEq/L (ref 3.5–5.1)
Sodium: 136 mEq/L (ref 135–145)

## 2011-02-27 LAB — BLOOD GAS, ARTERIAL
Acid-base deficit: 0.7 mmol/L (ref 0.0–2.0)
pCO2 arterial: 36.5 mmHg (ref 35.0–45.0)
pH, Arterial: 7.419 (ref 7.350–7.450)

## 2011-02-27 LAB — POCT I-STAT 3, ART BLOOD GAS (G3+)
Bicarbonate: 24.8 mEq/L — ABNORMAL HIGH (ref 20.0–24.0)
O2 Saturation: 99 %
O2 Saturation: 99 %
Patient temperature: 35.1
TCO2: 26 mmol/L (ref 0–100)
pCO2 arterial: 37.2 mmHg (ref 35.0–45.0)
pCO2 arterial: 42.4 mmHg (ref 35.0–45.0)
pH, Arterial: 7.372 (ref 7.350–7.450)
pO2, Arterial: 143 mmHg — ABNORMAL HIGH (ref 80.0–100.0)

## 2011-02-27 LAB — CREATININE, SERUM
Creatinine, Ser: 1.15 mg/dL (ref 0.4–1.5)
GFR calc non Af Amer: >60 mL/min (ref >60)

## 2011-02-27 LAB — PROTIME-INR: INR: 1 (ref 0.00–1.49)

## 2011-02-27 LAB — FOLATE: Folate: 15.6 ng/mL

## 2011-02-27 LAB — CK TOTAL AND CKMB (NOT AT ARMC)
CK, MB: 4.1 ng/mL — ABNORMAL HIGH (ref 0.3–4.0)
CK, MB: 4.9 ng/mL — ABNORMAL HIGH (ref 0.3–4.0)
Relative Index: 4.1 — ABNORMAL HIGH (ref 0.0–2.5)
Relative Index: 7.1 — ABNORMAL HIGH (ref 0.0–2.5)
Total CK: 119 U/L (ref 7–232)

## 2011-02-27 LAB — TROPONIN I: Troponin I: 1.6 ng/mL (ref 0.00–0.06)

## 2011-02-27 LAB — APTT: aPTT: 81 seconds — ABNORMAL HIGH (ref 24–37)

## 2011-02-27 LAB — VITAMIN B12: Vitamin B-12: 113 pg/mL — ABNORMAL LOW (ref 211–911)

## 2011-02-27 LAB — TSH: TSH: 0.671 u[IU]/mL (ref 0.350–4.500)

## 2011-02-27 LAB — FOLATE RBC: RBC Folate: 330 ng/mL (ref 180–600)

## 2011-04-03 NOTE — Assessment & Plan Note (Signed)
[facility]                          [facility] OFFICE NOTE   Dalton Phillips, Dalton Phillips                        MRN:          [id number]  DATE:04/20/2009                            DOB:          1940-12-31    PRIMARY CARE PHYSICIAN:  Johny Blamer, MD   REASON FOR VISIT:  Post hospital followup.   HISTORY OF PRESENT ILLNESS:  I met Dalton Phillips in mid May in the setting  of unstable angina/acute coronary syndrome.  He is a pleasant 70-year-  old male, who presented to [facility] with new onset chest pain, shortness of breath, and weakness;  and ultimately manifested mild enzymatic evidence of a non-ST-elevation  myocardial infarction.  He was transferred to [facility] and  underwent a diagnostic cardiac catheterization by Dr. Riley Kill on Mar 30, 2009.  This revealed severe single-vessel coronary artery disease with a  heavily calcified left anterior descending and stenosis of 75-80% within  the proximal portion of the vessel.  There was nonobstructive plaquing  within the remaining epicardial vessels and overall left ventricular  systolic function was globally normal.  Films were reviewed by the  Interventional Cardiology Team and subsequently Dr. Laneta Simmers was involved  for opinion regarding surgical management.  This was felt to be the best  approach.  After consultation, the patient underwent a off-pump LIMA to  left anterior descending on Apr 04, 2009, following Plavix washout.  He  tolerated this well and was discharged home on medical therapy on Apr 08, 2009.   Dalton Phillips comes into the clinic with his wife today.  Overall, he is  progressing fairly well.  He has been walking up to 15 minutes at a time  twice a day [place].  He is not having any anginal chest  pain.  Postsurgical pain is improving with p.r.n. hydrocodone.  An  electrocardiogram today shows sinus bradycardia at 58 beats per  minute  with nonspecific T-wave changes.  We reviewed his medications and  discussed some modification of therapy over time.  His lipid numbers  from hospitalization revealed a total cholesterol of 138, triglycerides  114, HDL 31, and LDL 89.   ALLERGIES:  No known drug allergies.   PRESENT MEDICATIONS:  1. Prilosec 20 mg p.o. daily.  2. Aspirin 325 mg p.o. daily.  3. Toprol-XL 25 mg p.o. daily.  4. Crestor 40 mg p.o. nightly.  5. Hydrocodone 7.5/500 mg p.o. q.4-6 hours p.r.n.   REVIEW OF SYSTEMS:  As outlined above.  He has been sleeping a lot.  Appetite has been improving slowly.  Otherwise, systems reviewed and  negative.   PHYSICAL EXAMINATION:  VITAL SIGNS:  Blood pressure is 118/72, heart  rate is 58 and regular, and weight 158 pounds.  GENERAL:  The patient is comfortable in no acute distress.  HEENT:  Conjunctivae is normal.  Oropharynx clear.  NECK:  Supple.  No elevated jugular venous pressure.  No loud bruits.  No thyromegaly.  LUNGS:  Clear without labored breathing at rest, somewhat diminished at  the  bases, right greater than left.  CHEST:  Thorax shows well-healing sternal incision with no erythema or  drainage.  Chest wall appears to be stable.  CARDIAC:  Regular rate and rhythm.  No S3 gallop or pericardial rub.  ABDOMEN:  Soft and nontender.  Bowel sounds are present.  EXTREMITIES:  No cyanosis, clubbing, or edema.  Pulses are 1+ to 2+.  SKIN:  Warm and dry.  MUSCULOSKELETAL:  No kyphosis noted.  NEUROPSYCHIATRIC:  The patient is alert and oriented x3.  Affect is  appropriate.   IMPRESSION AND RECOMMENDATIONS:  Severe single-vessel coronary artery  disease, status post non-ST-elevation myocardial infarction and  subsequent off-pump left internal mammary artery to left anterior  descending in May 2010.  Ejection fraction was globally normal at  angiography.  Dalton Phillips appears to be recuperating fairly well in the  early postoperative setting and is due to  see Dr. Laneta Simmers in the office  early next week.  I have recommended that he continue medical therapy,  although we will cut his Toprol-XL in half, and when he finishes the  present bottle of Crestor, this will be transitioned to generic  simvastatin 10 mg daily.  A prescription for sublingual nitroglycerin  was also given.  I will plan to see him back in the office over the next  6 weeks.     Jonelle Sidle, MD  Electronically Signed    SGM/MedQ  DD: 04/20/2009  DT: 04/20/2009  Job #: [id number]   cc:   Johny Blamer, MD  Evelene Croon, M.D.

## 2011-04-03 NOTE — Discharge Summary (Signed)
Dalton Phillips, FOTI               ACCOUNT NO.:  [id number]   MEDICAL RECORD NO.:  [id number]          PATIENT TYPE:  INP   LOCATION:  2015                         FACILITY:  [facility]   PHYSICIAN:  Evelene Croon, M.D.     DATE OF BIRTH:  07-Oct-1941   DATE OF ADMISSION:  03/29/2009  DATE OF DISCHARGE:  04/08/2009                               DISCHARGE SUMMARY   PRIMARY ADMITTING DIAGNOSIS:  Chest pain.   ADDITIONAL/DISCHARGE DIAGNOSES:  1. Severe single-vessel coronary artery disease.  2. Non-ST-segment elevation myocardial infarction.  3. History of tobacco abuse.   PROCEDURES PERFORMED:  1. Cardiac catheterization.  2. Off-pump coronary artery bypass grafting x1 (left internal mammary      artery to the left anterior descending).   HISTORY:  The patient is a 70 year old male with no significant past  medical history aside from tobacco abuse.  Over the past several weeks,  he has had increasing exertional fatigue and exercise intolerance.  The  day prior to this admission, he developed substernal chest discomfort  associated with shortness of breath and generalized weakness.  This  occurred with exertion and lasted despite rest.  When the pain  persisted, he requested that his wife bring him to the emergency  department and they presented to the [facility] for evaluation.  At the time he presented to [facility], his pain had resolved.  EKG showed nonspecific ST changes and a CT scan of the chest was  performed and a pulmonary embolism was ruled out.  He did have elevation  of his troponin up to 1.6.  His initial CPK was 124 with an MB of 0.  Because of these findings, he was subsequently transferred to [facility] for further cardiac workup.   HOSPITAL COURSE:  He was transferred to [facility] on Mar 29, 2009, under the care of [facility].  He was started on heparin  and loaded with Plavix.  He is taken to the Cath Lab on Mar 30, 2009,  and underwent cardiac catheterization by Dr. Riley Kill.  This showed a  long area of proximal LAD stenosis from about 75% to 90% stenosis.  The  circumflex was a large dominant vessel with no significant stenosis.  The right coronary artery was a smaller vessel with no significant  stenosis.  Left ventricular function was normal.  Because of his severe  single-vessel coronary artery disease, it was felt that he would benefit  from coronary artery bypass grafting.  He was seen by Dr. Evelene Croon  and his films were reviewed.  Dr. Laneta Simmers felt that he would be a good  candidate for a single vessel bypass with possibly off-pump if possible.  He explained all risks, benefits, and alternatives of the surgery to the  patient and he agreed to proceed.  Because the patient had been loaded  with Plavix and was on the tracer study prior to his catheterization,  his surgery was scheduled such that Plavix washout to occur.  He  remained stable and pain free in the hospital prior to surgery.  He  underwent a complete preoperative workup including carotid Doppler  studies, which showed no ICA stenosis and ABIs which were within normal  limits bilaterally.  He was taken to the operating room on Apr 04, 2009,  and underwent off-pump CABG x1 by Dr. Laneta Simmers.  Please see previously  dictated operative report for complete details of surgery.  He tolerated  the procedure well and was transferred to the SICU in stable condition.  He was able to be extubated shortly after surgery.  He was  hemodynamically stable and doing well on postop day #1.  His chest tubes  and lines were removed.  He was kept in the unit for most of the day for  evaluation.  By late in the day postop day #1, he was ready for transfer  to the [facility] Unit.  He has been started on a beta-blocker and  continued on the statin.  He has also been started on a diuretic and  presently, he has diuresed back down to his preoperative weight with  only  mild lower extremity edema on physical exam.  He has remained  afebrile and his vital signs have been stable.  His most recent labs on  postop day #3 show a sodium of 140, potassium 4.1, BUN 13, creatinine  1.04.  White count 8.8, hemoglobin 9.9, hematocrit 27.6, platelet count  106.  He is ambulating in the halls with Cardiac Rehab Phase I and is  making good progress.  He is tolerating regular diet and is having  normal bowel and bladder function.  It is felt that if he continues to  remain stable over the next 24 hours he will hopefully be ready for  discharge home on Apr 08, 2009.   DISCHARGE MEDICATIONS:  1. Enteric-coated aspirin 325 mg daily.  2. Toprol-XL 25 mg daily.  3. Crestor 40 mg nightly.  4. Oxycodone 5 mg 1-2 q.4 h. p.r.n. for pain.   DISCHARGE INSTRUCTIONS:  He is asked to refrain from driving, heavy  lifting, or strenuous activity.  He may continue ambulating daily and  using his incentive spirometer.  He may shower daily and clean his  incisions with soap and water.  He will continue a low-fat, low-sodium  diet.   DISCHARGE FOLLOWUP:  He is asked to make an appointment to see Dr.  Diona Browner at [facility] near [facility] in 2 weeks.  He will  then follow up with Dr. Laneta Simmers on April 26, 2009, with a chest x-ray prior  to this appointment.  In the interim if he experiences any problems, he  is asked to contact our office immediately.      Coral Ceo, P.A.      Evelene Croon, M.D.  Electronically Signed    GC/MEDQ  D:  04/07/2009  T:  04/08/2009  Job:  [id number]   cc:   Jonelle Sidle, MD  [facility] Dr. Robb Matar Office

## 2011-04-03 NOTE — Op Note (Signed)
Dalton Phillips, Dalton Phillips               ACCOUNT NO.:  [id number]   MEDICAL RECORD NO.:  [id number]          PATIENT TYPE:  INP   LOCATION:  2314                         FACILITY:  [facility]   PHYSICIAN:  Evelene Croon, M.D.     DATE OF BIRTH:  [birth year]   DATE OF PROCEDURE:  04/04/2009  DATE OF DISCHARGE:                               OPERATIVE REPORT   PREOPERATIVE DIAGNOSIS:  Severe single-vessel coronary artery disease.   POSTOPERATIVE DIAGNOSIS:  Severe single-vessel coronary artery disease.   OPERATIVE PROCEDURE:  Median sternotomy, off-pump coronary artery bypass  graft surgery x1 using left internal mammary artery graft to the left  anterior descending coronary artery.   SURGEON:  Evelene Croon, MD   ASSISTANT:  Doree Fudge, PA-C   ANESTHESIA:  General endotracheal.   CLINICAL HISTORY:  This patient is a 70 year old white male with history  of heavy smoking with several-month history of fatigue and tiredness who  developed new onset substernal chest pain while doing a hole.  He ruled  in for a non-ST-segment elevation MI.  Catheterization showed a long  high-grade proximal LAD stenosis with insignificant disease in the left  circumflex and right coronary territories.  Ejection fraction was  normal.  He did receive 600 mg of Plavix and was placed on the tracer  study.  Both of these were held.  After review of his angiogram  examination, the patient was felt that off-pump coronary artery bypass  surgery x1 will be the best treatment to prevent further ischemia and  infarction.  I discussed the operative procedure with the patient and  his wife including alternatives, benefits, and risks including but not  limited to bleeding, blood transfusion, infection, stroke, myocardial  infarction, graft failure, and death.  Also discussed the importance of  maximum cardiac risk factor reduction including complete smoking  cessation.  He said that he understood all of these and agreed  to  proceed.   OPERATIVE PROCEDURE:  The patient was taken to the operating room and  placed on the table in supine position.  After induction of general  endotracheal anesthesia, a Foley catheter was placed in the bladder  using sterile technique.  Then, the chest, abdomen, and both lower  extremities were prepped and draped in usual sterile manner.  The chest  was entered through a median sternotomy incision and the pericardium  opened midline.  Examination of the heart showed good ventricular  contractility.  The ascending aorta had no palpable plaques in it.   The left internal mammary artery was harvested from the chest wall using  pedicle graft.  This was a medium caliber vessel with excellent blood  flow throughout.  Then, the patient was given the off-pump dose of  heparin which was about 70,000 units.  When adequate activated clotting  time was achieved, two laparotomy pads were placed behind the heart to  expose the LAD.  This artery was heavily diseased proximally, but was a  medium-sized graftable vessel throughout the mid and distal portions.  Then, vessel loops were placed around the LAD proximal and distal to the  planned arteriotomy.  The LAD was stabilized using the Octopus  stabilizer.  This resulted in good stabilization of the LAD.  Then, the  LAD was opened.  The vessel loops were tightened and there was good  hemostasis.  The arteriotomy was completed.  Then, the left internal  mammary graft was brought through the left pericardium anterior to the  phrenic nerve.  It was anastomosed the LAD in end-to-side manner using  continuous 8-0 Prolene suture.  The tapes were removed from around the  LAD and the clamp removed from the left internal mammary pedicle.  There  was a very nice-appearing anastomosis and complete hemostasis.  The  pedicle was sutured to the epicardium with 6-0 Prolene sutures.  The  stabilizing retractor was removed.  The laparotomy pads were  removed  from behind the heart.  The patient remained hemodynamically stable  throughout the procedure.  Then, protamine was given.  Hemostasis was  achieved without difficulty.  Two temporary right ventricular and right  atrial pacing wires were placed and brought out through the skin.  Three  chest tubes were placed with the tube in the posterior pericardium, one  in the left pleural space, and one in the anterior mediastinum.  The  pericardium was loosely reapproximated over the heart.  The sternum was  closed with #6 stainless steel wires.  The fascia was closed with  continuous #1 Vicryl suture.  Subcutaneous tissue was closed with  continuous 2-0 Vicryl, and the skin with a 3-0 Vicryl subcuticular  closure.  The sponge, needle, and instruments counts were correct  according to the scrub nurse.  Dry sterile dressing were applied over  the incision, and around the chest tube with Pleur-Evac suction.  The  patient remained hemodynamically stable and was transported to the SICU  in guarded, but stable condition.      Evelene Croon, M.D.  Electronically Signed     BB/MEDQ  D:  04/04/2009  T:  04/05/2009  Job:  [id number]

## 2011-04-03 NOTE — Cardiovascular Report (Signed)
Dalton Phillips, Dalton Phillips               ACCOUNT NO.:  [id number]   MEDICAL RECORD NO.:  [id number]          PATIENT TYPE:  INP   LOCATION:  4729                         FACILITY:  [facility]   PHYSICIAN:  Arturo Morton. Riley Kill, MD, FACCDATE OF BIRTH:  04/21/1941   DATE OF PROCEDURE:  03/30/2009  DATE OF DISCHARGE:                            CARDIAC CATHETERIZATION   INDICATIONS:  [name] is a 70 year old gentleman admitted with chest  discomfort.  Enzymes are borderline positive.  The current study was  done to assess coronary anatomy.   PROCEDURE:  1. Left heart catheterization.  2. Selective coronary arteriography.  3. Selective left ventriculography.  4. Subclavian angiography.   DESCRIPTION OF PROCEDURE:  The patient was brought to the  catheterization laboratory, and prepped and draped in usual fashion.  Through an anterior puncture, the right femoral artery was easily  entered.  A 5-French sheath was then placed.  There was some oozing.  Views of the left and right coronary arteries were obtained in multiple  angiographic projections.  Central aortic and left ventricular pressures  were measured with pigtail.  Ventriculography was performed in the RAO  projection.  There were no major complications.  I then discussed with  the patient in great detail on the table the potential options.  I have  also reviewed the films with Dr. Excell Seltzer, and also Dr. Gala Romney  subsequently.  I discussed the case with the patient's wife.  At issue,  the patient has calcified segmental LAD disease that would likely  require overlapping stents with rotational atherectomy.  Our  recommendation to the patient was to consider minimally invasive bypass  with an internal mammary to the LAD.  I spoke with Dr. Sheliah Plane  and his team.  We will see the patient in the morning.  The patient was  willing to agree to this approach.  There were no major complications.   HEMODYNAMIC DATA:  1. Central aortic  pressure 142/74, mean 100.  2. Left ventricular pressure 128/50.  3. No gradient pullback across aortic valve.   ANGIOGRAPHIC DATA:  1. On plain fluoroscopy, there is extensive calcification of the      proximal LAD extending down into the mid LAD.  2. Left main has a calcified ridge that is distal most aspect, but      with his outs was significant narrowing.  3. The left anterior descending artery courses to the apex.  There is      disputes extensive calcification throughout the proximal and mid      vessel.  This includes an overlapping area of disease extending      into the LAD after the diagonal.  The LAD has several lesions, and      measuring probably 80% and then 75-80% luminal reduction.  The      distal vessel caliber is relatively small, but does open up some      with nitroglycerin.  It does appear to be graftable.  There is      probably some moderate plaque at the takeoff of the LAD diagonal,  but it is not critical.  4. The circumflex is a large-dominant vessel.  There is a tiny ramus      intermedius, there is a first marginal that is relatively small in      caliber and a second marginal that has some ostial narrowing of      probably 40-50%.  It is a small-caliber vessel not suitable for      grafting.  The AV circumflex, however, is a very large-caliber      vessel supplying 2 large posterolateral branches in a posterior      descending branch, none of which have critical narrowing.  5. The right is a nondominant vessel with about mid 30% narrowing.  6. The ventriculogram demonstrates preserved global systolic function      without a definite wall motion abnormality.   CONCLUSIONS:  High-grade disease of the proximal mid left anterior  descending artery with moderately heavy calcification throughout.   DISPOSITION:  We discussed the options with the patient.  Specifically,  the issue is the fact that the patient has a fairly heavy calcified  vessel that is  segmentally plaqued.  There is a potential for some  malexpansion as well as need for long overlapping stents.  Prolonged  dual anti-platelet therapy would most likely be necessary, and in  addition, there would potentially be some modest increase in risk.  Given the extensive calcification and long segmental nature of the  lesion, some consideration should be given to revascularization with the  internal mammary to the LAD.  We will have the surgeon see the patient  in consultation, and final disposition pending this.      Arturo Morton. Riley Kill, MD, [facility]  Electronically Signed     TDS/MEDQ  D:  03/30/2009  T:  03/31/2009  Job:  [id number]

## 2011-04-03 NOTE — Assessment & Plan Note (Signed)
OFFICE VISIT   RAY, Dalton Phillips  DOB:  Feb 27, 1941                                        April 26, 2009  CHART #:  [id number]   HISTORY OF PRESENT ILLNESS:  The patient returned today for followup  status post off-pump coronary bypass surgery x1 using a left internal  mammary graft to the LAD on Apr 04, 2009.  He has continued to abstain  smoking.  He has had no chest pain or pressure and no shortness of  breath.  His only complaint is of hypersensitivity along the left  anterior chest wall which I feel is probably related to harvesting the  left internal mammary artery.  He does report that he does get tired and  takes several naps during the day.   PHYSICAL EXAMINATION:  His blood pressure is 117/67, his pulse is 53 and  regular.  Respiratory rate is 18, unlabored.  Oxygen saturation on room  air is 96%.  He looks well.  Cardiac exam shows a regular rate and  rhythm with normal heart sounds.  His lung exam is clear.  The chest  incision is healing well, and the sternum is stable.  There is no  peripheral edema.   DIAGNOSTIC TEST:  Followup chest x-ray shows clear lung fields and no  pleural effusions.   MEDICATIONS:  1. Crestor 40 mg at bedtime  2. Lopressor 25 mg daily.  3. Aspirin 325 mg daily.  4. Hydrocodone p.r.n. for pain.  I wrote him another prescription      today for hydrocodone 7.5/500 one q.4 h p.r.n. for pain #40.   IMPRESSION:  Overall, the patient is making a good recovery following  his surgery.  I told him that I would expect his energy level to  continue to improve, but it may take 3 months to get back to normal.  He  was not on any medications preoperatively and now is on a beta-blocker  which may have some effect on his energy level.  I told him he could  return to driving a car when feels comfortable with that, should refrain  lifting anything heavier than 10 pounds for a total of 3  months from date of surgery.  He will continue  to follow up with Dr.  Diona Browner and will contact me if develops any problems with his  incisions.   Evelene Croon, M.D.  Electronically Signed   BB/MEDQ  D:  04/26/2009  T:  04/27/2009  Job:  [id number]   cc:   Jonelle Sidle, MD

## 2011-04-03 NOTE — Consult Note (Signed)
Dalton Phillips, Dalton Phillips NO.:  [id number]   MEDICAL RECORD NO.:  [id number]          PATIENT TYPE:  INP   LOCATION:  2928                         FACILITY:  [facility]   PHYSICIAN:  Dalton Phillips, M.D.     DATE OF BIRTH:  1941/09/08   DATE OF CONSULTATION:  03/31/2009  DATE OF DISCHARGE:                                 CONSULTATION   REFERRING PHYSICIAN:  Arturo Phillips. Dalton Kill, MD, [facility]   REASON FOR CONSULTATION:  High-grade LAD stenosis, status post non-ST-  segment elevation MI.   CLINICAL HISTORY:  I was asked by Dr. Riley Phillips to evaluate Dalton Phillips for  consideration of coronary artery bypass graft surgery to the LAD.  He is  a 70 year old white male with a history of heavy smoking who has  generally avoided going to the doctor.  He has been relatively healthy  with no complaints, although his wife does note that he has had more  tiredness and exertional fatigue over the past several months.  He  denies this and attributes that to just working hard.  On Tuesday, he  was digging a hole in the yard for plant a tree and developed some  substernal chest burning associated with generalized weakness and  shortness of breath.  This lasts for about 10 minutes and he asked his  wife to bring him to the Emergency Department.  His pain improved after  taking two aspirin, but then worsened while he was driving to [facility].  His pain resolved prior to presenting there.  He did have a  troponin 0.05 initially and a CPK of 124, MB of 0.0.  His D-dimer was  0.77 and a CT scan of the chest was done which rule out pulmonary  embolism.  Electrocardiogram showed nonspecific ST changes.  His  subsequent troponin increased to 1.24 and then 1.6.  He was therefore  transferred to [facility] and underwent cardiac catheterization  yesterday, which showed a long area of proximal LAD stenosis from 75-90%  narrowed.  The left circumflex was a large dominant vessel with no  significant  stenosis.  The right coronary artery was a smaller vessel  with no significant stenosis.  Left ventricular function was normal.  There was no gradient across the aortic valve and no mitral  regurgitation.  The left internal mammary artery was widely patent.   His past medical history is significant only for an episode of shingles  about 1 year ago.  He has never been hospitalized since childhood and  has had no surgery.   REVIEW OF SYSTEMS:  GENERAL:  He denies any fever or chills.  Denies any  recent weight changes.  He denies fatigue, although his wife reports  that he has had fatigue and generalized weakness.  EYES:  Negative.  ENT:  Negative.  ENDOCRINE:  He denies diabetes and hypothyroidism.  CARDIOVASCULAR:  As above.  Denies PND or orthopnea.  He has had  exertional dyspnea.  Denies palpitations and peripheral edema.  RESPIRATORY:  Denies cough and hemoptysis.  He has had no sputum  production.  GI:  He denies any nausea or vomiting.  Denies melena and  bright red blood per rectum.  GU:  He denies dysuria and hematuria.  MUSCULOSKELETAL:  He denies arthralgias and myalgias.  VASCULAR:  He  denies claudication and phlebitis.  NEUROLOGIC:  He denies any focal  weakness or numbness.  Denies dizziness and syncope.  He has never had  any TIA or stroke.  PSYCHIATRIC:  Negative.   ALLERGIES:  None.   SOCIAL HISTORY:  He is married and retired from [employer].  He  smokes about 1-1/2-pack of cigarettes per day and has for least 50  years.  He denies alcohol use.   FAMILY HISTORY:  Positive for cardiac disease.  His father died at age  51 after development of heart disease in his 42s and undergo a coronary  artery bypass at that time.  He had an uncle who had coronary artery  disease in his 23s.  He has an another uncle who had heart disease  prematurely.   PHYSICAL EXAMINATION:  VITAL SIGNS:  His blood pressure is 136/67.  His  pulse is 63 and regular.  Respiratory rate is  18 and unlabored.  Oxygen  saturation is 100% on room air.  GENERAL:  He is a well-developed white male in no distress.  HEENT:  Normocephalic and atraumatic.  Pupils are equal and reactive  light and accommodation.  Extraocular muscles are intact.  His throat is  clear.  NECK:  Normal carotid pulses bilaterally.  There are no bruits.  There  is no adenopathy or thyromegaly.  CARDIAC:  Regular rate and rhythm with normal S1 and S2.  There is no  murmur, rub, or gallop.  LUNGS:  Clear.  ABDOMEN:  Active bowel sounds.  His abdomen his soft, flat, and  nontender.  There are no palpable masses or organomegaly.  EXTREMITIES:  No peripheral edema.  Pedal pulses are palpable  bilaterally.  SKIN:  Warm and dry.  NEUROLOGIC:  Alert and oriented x3.  Motor and sensory exams are grossly  normal.   LABORATORY EXAMINATION:  Normal electrolytes with BUN of 10, creatinine  0.9, albumin 3.8.  Hemoglobin was 13.1 on admission, platelet count  138,000.  Carotid Doppler examination shows no evidence of internal  carotid artery stenosis.  Vertebral artery flow was antegrade  bilaterally.   IMPRESSION:  Dalton Phillips has severe single-vessel coronary artery disease  with long calcified and high-grade proximal left anterior descending  stenosis with normal left ventricular function, presenting with non-ST-  segment elevation myocardial infarction.  He is now pain free on  heparin.  He did receive 600 mg of Plavix and is on tracer study.  Both  of these drugs have been held.  I agree that coronary artery bypass  graft surgery is the best treatment to prevent further ischemia and  infarction.  I would plan to do a left internal mammary graft to the  left anterior descending, off-pump.  I discussed the operative procedure  with the patient and his wife including alternatives, benefits, and  risks including but not limited to bleeding, blood transfusion,  infection, stroke, myocardial infarction, graft  failure, and death.  I  also discussed the importance of maximum cardiac risk factor reduction  including complete smoking cessation.  He understands and agrees to  proceed.  We will plan to do this on Monday, Apr 04, 2009.      Dalton Phillips, M.D.  Electronically Signed     BB/MEDQ  D:  03/31/2009  T:  04/01/2009  Job:  [id number]

## 2011-10-23 ENCOUNTER — Telehealth: Payer: Self-pay | Admitting: Cardiology

## 2011-10-23 ENCOUNTER — Other Ambulatory Visit: Payer: Self-pay | Admitting: *Deleted

## 2011-10-23 DIAGNOSIS — Z79899 Other long term (current) drug therapy: Secondary | ICD-10-CM

## 2011-10-23 DIAGNOSIS — E785 Hyperlipidemia, unspecified: Secondary | ICD-10-CM

## 2011-10-23 NOTE — Telephone Encounter (Signed)
Called and informed wife that lab orders sent to the [facility].

## 2011-11-12 ENCOUNTER — Encounter: Payer: Self-pay | Admitting: *Deleted

## 2011-11-22 ENCOUNTER — Ambulatory Visit (INDEPENDENT_AMBULATORY_CARE_PROVIDER_SITE_OTHER): Payer: Medicare Other | Admitting: Physician Assistant

## 2011-11-22 ENCOUNTER — Encounter: Payer: Self-pay | Admitting: Physician Assistant

## 2011-11-22 VITALS — BP 135/79 | HR 71 | Ht 67.0 in | Wt 176.0 lb

## 2011-11-22 DIAGNOSIS — I2510 Atherosclerotic heart disease of native coronary artery without angina pectoris: Secondary | ICD-10-CM

## 2011-11-22 DIAGNOSIS — Z79899 Other long term (current) drug therapy: Secondary | ICD-10-CM

## 2011-11-22 DIAGNOSIS — E782 Mixed hyperlipidemia: Secondary | ICD-10-CM

## 2011-11-22 MED ORDER — OMEGA-3 FISH OIL 1000 MG PO CAPS: 1.0000 | ORAL_CAPSULE | Freq: Two times a day (BID) | ORAL | Status: DC

## 2011-11-22 MED ORDER — NITROGLYCERIN 0.4 MG SL SUBL: 0.4000 mg | SUBLINGUAL_TABLET | SUBLINGUAL | Status: DC | PRN

## 2011-11-22 NOTE — Patient Instructions (Addendum)
Your physician wants you to follow-up in: 6 months. You will receive a reminder letter in the mail one-two months in advance. If you don't receive a letter, please call our office to schedule the follow-up appointment. Omega III Fish Oil-Take 1 capsule by mouth two times a day. Obtain this over-the-counter. Your physician recommends that you go to the [facility] for a FASTING lipid profile and liver function labs. Do not eat or drink after midnight. DO THIS LAB IN 3 MONTHS (AROUND 02/20/2012.) If the results of your test are normal or stable, you will receive a letter. If they are abnormal, the nurse will contact you by phone.

## 2011-11-22 NOTE — Assessment & Plan Note (Signed)
Quiescent and on current medication regimen. No further workup currently indicated. Renew prescription for NTG. Resume followup with Dr. Diona Browner in 6 months.

## 2011-11-22 NOTE — Progress Notes (Signed)
HPI: Patient presents for scheduled followup. He was last seen here in clinic, by Dr. Diona Browner, December 2011.  In the interim, he has not developed any exertional CP. He continues to play golf, as often as he can. He is able to climb up an incline, or full flight of stairs, with no associated chest discomfort, or significant dyspnea. Since last seen, he has been taken off simvastatin, by Dr. Barbara Cower, and placed on statin, secondary to significant joint pain. The pravastatin also does induce some joint pain, but he has been able to tolerate this better.  Most recent lipid profile: LDL 115, with normal LFTs.  Also of note, he believes that he has many of the characteristics symptoms of Sjogren's syndrome. He showed me a name of 1 medication used to treat this, Evoxac, and I noted that this does carry an increased risk for patients with cardiovascular disease. I also suggested to him that he await a formal diagnosis of this condition, before even considering whether or not he requires treatment. Nevertheless, I indicated to him that this aforementioned medication would not be appropriate for him, given his underlying heart disease.   No Known Allergies  Current Outpatient Prescriptions  Medication Sig Dispense Refill  . aspirin EC 81 MG tablet Take 81 mg by mouth daily.        . nitroGLYCERIN (NITROSTAT) 0.4 MG SL tablet Place 0.4 mg under the tongue every 5 (five) minutes as needed.        . simvastatin (ZOCOR) 40 MG tablet Take 40 mg by mouth at bedtime.          Past Medical History  Diagnosis Date  . CAD (coronary artery disease)     LAD disease,otherwise nonobstructive,LVEF normal  . Myocardial infarction     NSTEMI 05/10  . Herpes zoster   . Hyperlipidemia     History   Social History  . Marital Status: Married    Spouse Name: N/A    Number of Children: N/A  . Years of Education: N/A   Occupational History  . Not on file.   Social History Main Topics  . Smoking status:  Former Smoker    Quit date: 03/19/2009  . Smokeless tobacco: Not on file  . Alcohol Use: Not on file  . Drug Use: Not on file  . Sexually Active: Not on file   Other Topics Concern  . Not on file   Social History Narrative  . No narrative on file    Family History  Problem Relation Age of Onset  . Coronary artery disease      family h/o    ROS: no nausea, vomiting; no fever, chills; no melena, hematochezia; no claudication  PHYSICAL EXAM:  There were no vitals taken for this visit. GENERAL: well-nourished, well-developed; NAD HEENT: NCAT, PERRLA, EOMI; sclera clear; no xanthelasma NECK: palpable bilateral carotid pulses, no bruits; no JVD; no TM LUNGS: CTA bilaterally CARDIAC: RRR (S1, S2); no significant murmurs; no rubs or gallops ABDOMEN: soft, non-tender; intact BS EXTREMETIES: intact distal pulses; no significant peripheral edema SKIN: warm/dry; no obvious rash/lesions MUSCULOSKELETAL: no joint deformity NEURO: no focal deficit; NL affect   EKG: reviewed and available in Electronic Records   ASSESSMENT & PLAN:

## 2011-11-22 NOTE — Assessment & Plan Note (Signed)
Will start omega-3 fish oil 1 by mouth twice a day, in attempt to achieve target LDL 70 or less, if feasible. Patient unable to tolerate increase in statin. He has since changed to pravastatin, which also causes joint pain. However, he has been able to tolerate this dose, since starting approximately one year ago. When last seen, he was on simvastatin, which appeared to produce more intense discomfort.

## 2012-02-28 ENCOUNTER — Telehealth: Payer: Self-pay | Admitting: *Deleted

## 2012-02-28 NOTE — Telephone Encounter (Signed)
Message copied by Arlyss Gandy on Thu Feb 28, 2012  4:39 PM ------      Message from: Rande Brunt      Created: Fri Feb 22, 2012  3:09 PM       LDL 116, unchanged. Increase Pravachol to 80 daily, and repeat FLP in 12 weeks.

## 2012-02-28 NOTE — Telephone Encounter (Signed)
Left message to call back on voice mail

## 2012-02-29 ENCOUNTER — Telehealth: Payer: Self-pay | Admitting: *Deleted

## 2012-02-29 NOTE — Telephone Encounter (Signed)
Notes Recorded by Lesle Chris, LPN on 02/25/8118 at 4:44 PM Patient notified and verbalized understanding. States he has plenty of the 40mg  Pravastatin at home & did not need rx sent to pharmacy at this time. Will send reminder in mail when due.

## 2012-02-29 NOTE — Telephone Encounter (Signed)
Message copied by Murriel Hopper on Fri Feb 29, 2012  4:44 PM ------      Message from: Rande Brunt      Created: Fri Feb 22, 2012  3:09 PM       LDL 116, unchanged. Increase Pravachol to 80 daily, and repeat FLP in 12 weeks.

## 2012-03-03 NOTE — Telephone Encounter (Signed)
Completed by Hoover Brunette, LPN-see additional phone note.

## 2012-07-07 ENCOUNTER — Telehealth: Payer: Self-pay | Admitting: Cardiology

## 2012-07-07 NOTE — Telephone Encounter (Signed)
pravastatin (PRAVACHOL) 80 MG tabletB  [facility]

## 2012-07-08 MED ORDER — PRAVASTATIN SODIUM 80 MG PO TABS: 80.0000 mg | ORAL_TABLET | Freq: Every day | ORAL | Status: DC

## 2012-07-29 ENCOUNTER — Ambulatory Visit (INDEPENDENT_AMBULATORY_CARE_PROVIDER_SITE_OTHER): Payer: Medicare Other | Admitting: Cardiology

## 2012-07-29 ENCOUNTER — Encounter: Payer: Self-pay | Admitting: Cardiology

## 2012-07-29 VITALS — BP 121/73 | HR 59 | Ht 67.0 in | Wt 170.8 lb

## 2012-07-29 DIAGNOSIS — I2510 Atherosclerotic heart disease of native coronary artery without angina pectoris: Secondary | ICD-10-CM

## 2012-07-29 DIAGNOSIS — E782 Mixed hyperlipidemia: Secondary | ICD-10-CM

## 2012-07-29 NOTE — Progress Notes (Signed)
   Clinical Summary [name] is a 71 y.o.male presenting for followup. He was seen back in January. He reports no angina or progressive shortness of breath. States that he has tolerated the higher dose Pravachol since it was increased after the lipids outlined below.  Lab work from April showed normal LFTs, triglycerides 70, cholesterol 175, HDL 45, LDL 116. He reportedly had followup lab work with Dr. Sherril Phillips in July.   No Known Allergies  Current Outpatient Prescriptions  Medication Sig Dispense Refill  . aspirin EC 81 MG tablet Take 81 mg by mouth daily.        . Cyanocobalamin (VITAMIN B-12) 2500 MCG SUBL Place 1 tablet under the tongue daily.      . pravastatin (PRAVACHOL) 80 MG tablet Take 1 tablet (80 mg total) by mouth daily.  30 tablet  6  . Tamsulosin HCl (FLOMAX) 0.4 MG CAPS Take 0.4 mg by mouth daily.        . nitroGLYCERIN (NITROSTAT) 0.4 MG SL tablet Place 1 tablet (0.4 mg total) under the tongue every 5 (five) minutes as needed.  25 tablet  1    Past Medical History  Diagnosis Date  . CAD (coronary artery disease)     LAD disease,otherwise nonobstructive,LVEF normal  . Myocardial infarction     NSTEMI 05/10  . Herpes zoster   . Hyperlipidemia     Social History Dalton Phillips reports that he quit smoking about 3 years ago. His smoking use included Cigarettes. He has a 60 pack-year smoking history. He has never used smokeless tobacco. Dalton Phillips has no alcohol history on file.  Review of Systems No palpitations, dizziness. Stable appetite. No reported bleeding episodes. Otherwise negative.  Physical Examination Filed Vitals:   07/29/12 1342  BP: 121/73  Pulse: 59   Comfortable in no acute distress.  HEENT: Conjuctivae and lids normal, oropharynx clear with moist mucosa.  Neck: Supple, no elevated JVP, no loud carotid bruits, no thyromegaly or tenderness.  Lungs: Nonlabored breathing at rest. CTA without rales or wheezes.  Thorax: Well-healed sternal incision.  Chest wall is stable.  Cor: PMI nondisplaced. RRR, normal S1/S2. No pathologic systolic murmurs. No S3 or rub.  Ext: No CCE. Distal pulses 2+.     Problem List and Plan   CORONARY ATHEROSCLEROSIS NATIVE CORONARY ARTERY Continue medical therapy and observation. Follow up arranged in 6 months.  MIXED HYPERLIPIDEMIA Request followup lab work from July to see how his LDL control is doing on high-dose Pravachol.    Jonelle Sidle, M.D., F.A.C.C.

## 2012-07-29 NOTE — Assessment & Plan Note (Signed)
Request followup lab work from July to see how his LDL control is doing on high-dose Pravachol.

## 2012-07-29 NOTE — Assessment & Plan Note (Signed)
Continue medical therapy and observation. Followup arranged in 6 months. 

## 2012-07-29 NOTE — Patient Instructions (Addendum)

## 2012-07-31 ENCOUNTER — Telehealth: Payer: Self-pay | Admitting: *Deleted

## 2012-07-31 NOTE — Telephone Encounter (Signed)
Patient informed. 

## 2012-07-31 NOTE — Telephone Encounter (Signed)
Message copied by Eustace Moore on Thu Jul 31, 2012  1:46 PM ------      Message from: Jonelle Sidle      Created: Tue Jul 29, 2012  4:26 PM       Reviewed. His lipids actually looked much better. I am glad that he is tolerating the higher dose of Pravachol. LDL is 82 now.

## 2012-08-04 ENCOUNTER — Ambulatory Visit: Payer: Medicare Other | Admitting: Cardiology

## 2013-01-22 ENCOUNTER — Other Ambulatory Visit: Payer: Self-pay | Admitting: Physician Assistant

## 2013-05-27 ENCOUNTER — Other Ambulatory Visit: Payer: Self-pay | Admitting: Physician Assistant

## 2013-05-28 ENCOUNTER — Other Ambulatory Visit: Payer: Self-pay | Admitting: Physician Assistant

## 2014-04-20 ENCOUNTER — Other Ambulatory Visit: Payer: Self-pay | Admitting: *Deleted

## 2014-04-20 MED ORDER — PRAVASTATIN SODIUM 80 MG PO TABS: 80.0000 mg | ORAL_TABLET | Freq: Every day | ORAL | Status: DC

## 2015-11-15 ENCOUNTER — Ambulatory Visit: Payer: Medicare Other | Admitting: Family Medicine

## 2015-11-23 DIAGNOSIS — J329 Chronic sinusitis, unspecified: Secondary | ICD-10-CM | POA: Diagnosis not present

## 2015-11-23 DIAGNOSIS — I2510 Atherosclerotic heart disease of native coronary artery without angina pectoris: Secondary | ICD-10-CM | POA: Diagnosis not present

## 2015-11-23 DIAGNOSIS — Z87891 Personal history of nicotine dependence: Secondary | ICD-10-CM | POA: Diagnosis not present

## 2015-11-23 DIAGNOSIS — Z6827 Body mass index (BMI) 27.0-27.9, adult: Secondary | ICD-10-CM | POA: Diagnosis not present

## 2016-02-27 DIAGNOSIS — E7800 Pure hypercholesterolemia, unspecified: Secondary | ICD-10-CM | POA: Diagnosis not present

## 2016-02-27 DIAGNOSIS — I2510 Atherosclerotic heart disease of native coronary artery without angina pectoris: Secondary | ICD-10-CM | POA: Diagnosis not present

## 2016-05-17 DIAGNOSIS — E7800 Pure hypercholesterolemia, unspecified: Secondary | ICD-10-CM | POA: Diagnosis not present

## 2016-05-17 DIAGNOSIS — I2510 Atherosclerotic heart disease of native coronary artery without angina pectoris: Secondary | ICD-10-CM | POA: Diagnosis not present

## 2016-06-29 DIAGNOSIS — Z7189 Other specified counseling: Secondary | ICD-10-CM | POA: Diagnosis not present

## 2016-06-29 DIAGNOSIS — Z125 Encounter for screening for malignant neoplasm of prostate: Secondary | ICD-10-CM | POA: Diagnosis not present

## 2016-06-29 DIAGNOSIS — E7800 Pure hypercholesterolemia, unspecified: Secondary | ICD-10-CM | POA: Diagnosis not present

## 2016-06-29 DIAGNOSIS — Z299 Encounter for prophylactic measures, unspecified: Secondary | ICD-10-CM | POA: Diagnosis not present

## 2016-06-29 DIAGNOSIS — Z1211 Encounter for screening for malignant neoplasm of colon: Secondary | ICD-10-CM | POA: Diagnosis not present

## 2016-06-29 DIAGNOSIS — Z79899 Other long term (current) drug therapy: Secondary | ICD-10-CM | POA: Diagnosis not present

## 2016-06-29 DIAGNOSIS — Z0000 Encounter for general adult medical examination without abnormal findings: Secondary | ICD-10-CM | POA: Diagnosis not present

## 2016-06-29 DIAGNOSIS — R5383 Other fatigue: Secondary | ICD-10-CM | POA: Diagnosis not present

## 2016-06-29 DIAGNOSIS — Z1389 Encounter for screening for other disorder: Secondary | ICD-10-CM | POA: Diagnosis not present

## 2016-07-05 DIAGNOSIS — E7800 Pure hypercholesterolemia, unspecified: Secondary | ICD-10-CM | POA: Diagnosis not present

## 2016-07-05 DIAGNOSIS — I2510 Atherosclerotic heart disease of native coronary artery without angina pectoris: Secondary | ICD-10-CM | POA: Diagnosis not present

## 2016-08-01 DIAGNOSIS — H40033 Anatomical narrow angle, bilateral: Secondary | ICD-10-CM | POA: Diagnosis not present

## 2016-08-01 DIAGNOSIS — H04123 Dry eye syndrome of bilateral lacrimal glands: Secondary | ICD-10-CM | POA: Diagnosis not present

## 2016-08-14 DIAGNOSIS — H26491 Other secondary cataract, right eye: Secondary | ICD-10-CM | POA: Diagnosis not present

## 2016-08-15 DIAGNOSIS — Z23 Encounter for immunization: Secondary | ICD-10-CM | POA: Diagnosis not present

## 2016-09-14 DIAGNOSIS — E7800 Pure hypercholesterolemia, unspecified: Secondary | ICD-10-CM | POA: Diagnosis not present

## 2016-09-14 DIAGNOSIS — I2510 Atherosclerotic heart disease of native coronary artery without angina pectoris: Secondary | ICD-10-CM | POA: Diagnosis not present

## 2016-11-16 DIAGNOSIS — Z6826 Body mass index (BMI) 26.0-26.9, adult: Secondary | ICD-10-CM | POA: Diagnosis not present

## 2016-11-16 DIAGNOSIS — J069 Acute upper respiratory infection, unspecified: Secondary | ICD-10-CM | POA: Diagnosis not present

## 2016-11-16 DIAGNOSIS — Z299 Encounter for prophylactic measures, unspecified: Secondary | ICD-10-CM | POA: Diagnosis not present

## 2016-11-16 DIAGNOSIS — M3500 Sicca syndrome, unspecified: Secondary | ICD-10-CM | POA: Diagnosis not present

## 2016-11-16 DIAGNOSIS — M159 Polyosteoarthritis, unspecified: Secondary | ICD-10-CM | POA: Diagnosis not present

## 2016-12-17 DIAGNOSIS — Z79899 Other long term (current) drug therapy: Secondary | ICD-10-CM | POA: Diagnosis not present

## 2016-12-17 DIAGNOSIS — R509 Fever, unspecified: Secondary | ICD-10-CM | POA: Diagnosis not present

## 2016-12-17 DIAGNOSIS — Z85828 Personal history of other malignant neoplasm of skin: Secondary | ICD-10-CM | POA: Diagnosis not present

## 2016-12-17 DIAGNOSIS — R55 Syncope and collapse: Secondary | ICD-10-CM | POA: Diagnosis not present

## 2016-12-17 DIAGNOSIS — Z049 Encounter for examination and observation for unspecified reason: Secondary | ICD-10-CM | POA: Diagnosis not present

## 2016-12-17 DIAGNOSIS — Z8249 Family history of ischemic heart disease and other diseases of the circulatory system: Secondary | ICD-10-CM | POA: Diagnosis not present

## 2016-12-17 DIAGNOSIS — I252 Old myocardial infarction: Secondary | ICD-10-CM | POA: Diagnosis not present

## 2016-12-17 DIAGNOSIS — R531 Weakness: Secondary | ICD-10-CM | POA: Diagnosis not present

## 2016-12-17 DIAGNOSIS — Z823 Family history of stroke: Secondary | ICD-10-CM | POA: Diagnosis not present

## 2016-12-17 DIAGNOSIS — R251 Tremor, unspecified: Secondary | ICD-10-CM | POA: Diagnosis not present

## 2016-12-17 DIAGNOSIS — R112 Nausea with vomiting, unspecified: Secondary | ICD-10-CM | POA: Diagnosis not present

## 2016-12-17 DIAGNOSIS — R4182 Altered mental status, unspecified: Secondary | ICD-10-CM | POA: Diagnosis not present

## 2016-12-17 DIAGNOSIS — R0600 Dyspnea, unspecified: Secondary | ICD-10-CM | POA: Diagnosis not present

## 2016-12-17 DIAGNOSIS — R410 Disorientation, unspecified: Secondary | ICD-10-CM | POA: Diagnosis not present

## 2016-12-17 DIAGNOSIS — Z951 Presence of aortocoronary bypass graft: Secondary | ICD-10-CM | POA: Diagnosis not present

## 2016-12-17 DIAGNOSIS — I2510 Atherosclerotic heart disease of native coronary artery without angina pectoris: Secondary | ICD-10-CM | POA: Diagnosis not present

## 2016-12-17 DIAGNOSIS — M79606 Pain in leg, unspecified: Secondary | ICD-10-CM | POA: Diagnosis not present

## 2016-12-17 DIAGNOSIS — Z888 Allergy status to other drugs, medicaments and biological substances status: Secondary | ICD-10-CM | POA: Diagnosis not present

## 2016-12-17 DIAGNOSIS — Z87442 Personal history of urinary calculi: Secondary | ICD-10-CM | POA: Diagnosis not present

## 2016-12-17 DIAGNOSIS — Z87891 Personal history of nicotine dependence: Secondary | ICD-10-CM | POA: Diagnosis not present

## 2016-12-17 DIAGNOSIS — E7800 Pure hypercholesterolemia, unspecified: Secondary | ICD-10-CM | POA: Diagnosis not present

## 2016-12-18 DIAGNOSIS — R251 Tremor, unspecified: Secondary | ICD-10-CM | POA: Diagnosis not present

## 2016-12-18 DIAGNOSIS — R410 Disorientation, unspecified: Secondary | ICD-10-CM | POA: Diagnosis not present

## 2016-12-18 DIAGNOSIS — M79606 Pain in leg, unspecified: Secondary | ICD-10-CM | POA: Diagnosis not present

## 2016-12-18 DIAGNOSIS — I2510 Atherosclerotic heart disease of native coronary artery without angina pectoris: Secondary | ICD-10-CM | POA: Diagnosis not present

## 2016-12-25 DIAGNOSIS — Z713 Dietary counseling and surveillance: Secondary | ICD-10-CM | POA: Diagnosis not present

## 2016-12-25 DIAGNOSIS — A419 Sepsis, unspecified organism: Secondary | ICD-10-CM | POA: Diagnosis not present

## 2016-12-25 DIAGNOSIS — Z09 Encounter for follow-up examination after completed treatment for conditions other than malignant neoplasm: Secondary | ICD-10-CM | POA: Diagnosis not present

## 2016-12-25 DIAGNOSIS — Z299 Encounter for prophylactic measures, unspecified: Secondary | ICD-10-CM | POA: Diagnosis not present

## 2016-12-25 DIAGNOSIS — M3500 Sicca syndrome, unspecified: Secondary | ICD-10-CM | POA: Diagnosis not present

## 2016-12-25 DIAGNOSIS — Z6826 Body mass index (BMI) 26.0-26.9, adult: Secondary | ICD-10-CM | POA: Diagnosis not present

## 2017-01-02 DIAGNOSIS — C44619 Basal cell carcinoma of skin of left upper limb, including shoulder: Secondary | ICD-10-CM | POA: Diagnosis not present

## 2017-01-02 DIAGNOSIS — D485 Neoplasm of uncertain behavior of skin: Secondary | ICD-10-CM | POA: Diagnosis not present

## 2017-01-02 DIAGNOSIS — Z299 Encounter for prophylactic measures, unspecified: Secondary | ICD-10-CM | POA: Diagnosis not present

## 2017-01-02 DIAGNOSIS — M1990 Unspecified osteoarthritis, unspecified site: Secondary | ICD-10-CM | POA: Diagnosis not present

## 2017-01-02 DIAGNOSIS — Z713 Dietary counseling and surveillance: Secondary | ICD-10-CM | POA: Diagnosis not present

## 2017-01-02 DIAGNOSIS — Z6826 Body mass index (BMI) 26.0-26.9, adult: Secondary | ICD-10-CM | POA: Diagnosis not present

## 2017-02-14 DIAGNOSIS — Z299 Encounter for prophylactic measures, unspecified: Secondary | ICD-10-CM | POA: Diagnosis not present

## 2017-02-14 DIAGNOSIS — Z6826 Body mass index (BMI) 26.0-26.9, adult: Secondary | ICD-10-CM | POA: Diagnosis not present

## 2017-02-14 DIAGNOSIS — K219 Gastro-esophageal reflux disease without esophagitis: Secondary | ICD-10-CM | POA: Diagnosis not present

## 2017-02-14 DIAGNOSIS — M3500 Sicca syndrome, unspecified: Secondary | ICD-10-CM | POA: Diagnosis not present

## 2017-02-14 DIAGNOSIS — C4491 Basal cell carcinoma of skin, unspecified: Secondary | ICD-10-CM | POA: Diagnosis not present

## 2017-02-14 DIAGNOSIS — N400 Enlarged prostate without lower urinary tract symptoms: Secondary | ICD-10-CM | POA: Diagnosis not present

## 2017-02-14 DIAGNOSIS — R109 Unspecified abdominal pain: Secondary | ICD-10-CM | POA: Diagnosis not present

## 2017-03-13 DIAGNOSIS — E7800 Pure hypercholesterolemia, unspecified: Secondary | ICD-10-CM | POA: Diagnosis not present

## 2017-03-13 DIAGNOSIS — I2510 Atherosclerotic heart disease of native coronary artery without angina pectoris: Secondary | ICD-10-CM | POA: Diagnosis not present

## 2017-07-11 DIAGNOSIS — Z0000 Encounter for general adult medical examination without abnormal findings: Secondary | ICD-10-CM | POA: Diagnosis not present

## 2017-07-11 DIAGNOSIS — Z6826 Body mass index (BMI) 26.0-26.9, adult: Secondary | ICD-10-CM | POA: Diagnosis not present

## 2017-07-11 DIAGNOSIS — Z79899 Other long term (current) drug therapy: Secondary | ICD-10-CM | POA: Diagnosis not present

## 2017-07-11 DIAGNOSIS — N400 Enlarged prostate without lower urinary tract symptoms: Secondary | ICD-10-CM | POA: Diagnosis not present

## 2017-07-11 DIAGNOSIS — B354 Tinea corporis: Secondary | ICD-10-CM | POA: Diagnosis not present

## 2017-07-11 DIAGNOSIS — Z125 Encounter for screening for malignant neoplasm of prostate: Secondary | ICD-10-CM | POA: Diagnosis not present

## 2017-07-11 DIAGNOSIS — I2510 Atherosclerotic heart disease of native coronary artery without angina pectoris: Secondary | ICD-10-CM | POA: Diagnosis not present

## 2017-07-11 DIAGNOSIS — M3500 Sicca syndrome, unspecified: Secondary | ICD-10-CM | POA: Diagnosis not present

## 2017-07-11 DIAGNOSIS — Z299 Encounter for prophylactic measures, unspecified: Secondary | ICD-10-CM | POA: Diagnosis not present

## 2017-07-11 DIAGNOSIS — E7800 Pure hypercholesterolemia, unspecified: Secondary | ICD-10-CM | POA: Diagnosis not present

## 2017-07-11 DIAGNOSIS — Z1389 Encounter for screening for other disorder: Secondary | ICD-10-CM | POA: Diagnosis not present

## 2017-07-11 DIAGNOSIS — R5383 Other fatigue: Secondary | ICD-10-CM | POA: Diagnosis not present

## 2017-07-11 DIAGNOSIS — Z7189 Other specified counseling: Secondary | ICD-10-CM | POA: Diagnosis not present

## 2017-08-15 DIAGNOSIS — Z23 Encounter for immunization: Secondary | ICD-10-CM | POA: Diagnosis not present

## 2017-08-29 DIAGNOSIS — E7800 Pure hypercholesterolemia, unspecified: Secondary | ICD-10-CM | POA: Diagnosis not present

## 2017-08-29 DIAGNOSIS — I2510 Atherosclerotic heart disease of native coronary artery without angina pectoris: Secondary | ICD-10-CM | POA: Diagnosis not present

## 2017-10-16 DIAGNOSIS — M159 Polyosteoarthritis, unspecified: Secondary | ICD-10-CM | POA: Diagnosis not present

## 2017-10-16 DIAGNOSIS — M3500 Sicca syndrome, unspecified: Secondary | ICD-10-CM | POA: Diagnosis not present

## 2017-10-16 DIAGNOSIS — Z6824 Body mass index (BMI) 24.0-24.9, adult: Secondary | ICD-10-CM | POA: Diagnosis not present

## 2017-10-16 DIAGNOSIS — I2510 Atherosclerotic heart disease of native coronary artery without angina pectoris: Secondary | ICD-10-CM | POA: Diagnosis not present

## 2017-10-16 DIAGNOSIS — Z299 Encounter for prophylactic measures, unspecified: Secondary | ICD-10-CM | POA: Diagnosis not present

## 2017-11-04 DIAGNOSIS — Z299 Encounter for prophylactic measures, unspecified: Secondary | ICD-10-CM | POA: Diagnosis not present

## 2017-11-04 DIAGNOSIS — R109 Unspecified abdominal pain: Secondary | ICD-10-CM | POA: Diagnosis not present

## 2017-11-04 DIAGNOSIS — Z6825 Body mass index (BMI) 25.0-25.9, adult: Secondary | ICD-10-CM | POA: Diagnosis not present

## 2017-11-04 DIAGNOSIS — N400 Enlarged prostate without lower urinary tract symptoms: Secondary | ICD-10-CM | POA: Diagnosis not present

## 2017-11-04 DIAGNOSIS — Z713 Dietary counseling and surveillance: Secondary | ICD-10-CM | POA: Diagnosis not present

## 2017-11-15 DIAGNOSIS — Z6823 Body mass index (BMI) 23.0-23.9, adult: Secondary | ICD-10-CM | POA: Diagnosis not present

## 2017-11-15 DIAGNOSIS — C44319 Basal cell carcinoma of skin of other parts of face: Secondary | ICD-10-CM | POA: Diagnosis not present

## 2017-11-15 DIAGNOSIS — Z299 Encounter for prophylactic measures, unspecified: Secondary | ICD-10-CM | POA: Diagnosis not present

## 2017-11-15 DIAGNOSIS — D485 Neoplasm of uncertain behavior of skin: Secondary | ICD-10-CM | POA: Diagnosis not present

## 2017-11-15 DIAGNOSIS — E7800 Pure hypercholesterolemia, unspecified: Secondary | ICD-10-CM | POA: Diagnosis not present

## 2017-11-15 DIAGNOSIS — I2510 Atherosclerotic heart disease of native coronary artery without angina pectoris: Secondary | ICD-10-CM | POA: Diagnosis not present

## 2017-12-06 DIAGNOSIS — C44310 Basal cell carcinoma of skin of unspecified parts of face: Secondary | ICD-10-CM | POA: Diagnosis not present

## 2017-12-06 DIAGNOSIS — Z299 Encounter for prophylactic measures, unspecified: Secondary | ICD-10-CM | POA: Diagnosis not present

## 2017-12-06 DIAGNOSIS — H6981 Other specified disorders of Eustachian tube, right ear: Secondary | ICD-10-CM | POA: Diagnosis not present

## 2017-12-06 DIAGNOSIS — M791 Myalgia, unspecified site: Secondary | ICD-10-CM | POA: Diagnosis not present

## 2017-12-06 DIAGNOSIS — Z6823 Body mass index (BMI) 23.0-23.9, adult: Secondary | ICD-10-CM | POA: Diagnosis not present

## 2018-01-14 DIAGNOSIS — Z6825 Body mass index (BMI) 25.0-25.9, adult: Secondary | ICD-10-CM | POA: Diagnosis not present

## 2018-01-14 DIAGNOSIS — Z713 Dietary counseling and surveillance: Secondary | ICD-10-CM | POA: Diagnosis not present

## 2018-01-14 DIAGNOSIS — J069 Acute upper respiratory infection, unspecified: Secondary | ICD-10-CM | POA: Diagnosis not present

## 2018-01-14 DIAGNOSIS — Z299 Encounter for prophylactic measures, unspecified: Secondary | ICD-10-CM | POA: Diagnosis not present

## 2018-02-28 DIAGNOSIS — I2510 Atherosclerotic heart disease of native coronary artery without angina pectoris: Secondary | ICD-10-CM | POA: Diagnosis not present

## 2018-02-28 DIAGNOSIS — E7800 Pure hypercholesterolemia, unspecified: Secondary | ICD-10-CM | POA: Diagnosis not present

## 2018-06-11 DIAGNOSIS — I2510 Atherosclerotic heart disease of native coronary artery without angina pectoris: Secondary | ICD-10-CM | POA: Diagnosis not present

## 2018-06-11 DIAGNOSIS — E7800 Pure hypercholesterolemia, unspecified: Secondary | ICD-10-CM | POA: Diagnosis not present

## 2018-06-13 DIAGNOSIS — Z6826 Body mass index (BMI) 26.0-26.9, adult: Secondary | ICD-10-CM | POA: Diagnosis not present

## 2018-06-13 DIAGNOSIS — Z87891 Personal history of nicotine dependence: Secondary | ICD-10-CM | POA: Diagnosis not present

## 2018-06-13 DIAGNOSIS — M3500 Sicca syndrome, unspecified: Secondary | ICD-10-CM | POA: Diagnosis not present

## 2018-06-13 DIAGNOSIS — Z299 Encounter for prophylactic measures, unspecified: Secondary | ICD-10-CM | POA: Diagnosis not present

## 2018-06-13 DIAGNOSIS — M79673 Pain in unspecified foot: Secondary | ICD-10-CM | POA: Diagnosis not present

## 2018-06-20 DIAGNOSIS — M79673 Pain in unspecified foot: Secondary | ICD-10-CM | POA: Diagnosis not present

## 2018-06-20 DIAGNOSIS — Z713 Dietary counseling and surveillance: Secondary | ICD-10-CM | POA: Diagnosis not present

## 2018-06-20 DIAGNOSIS — Z6825 Body mass index (BMI) 25.0-25.9, adult: Secondary | ICD-10-CM | POA: Diagnosis not present

## 2018-06-20 DIAGNOSIS — Z299 Encounter for prophylactic measures, unspecified: Secondary | ICD-10-CM | POA: Diagnosis not present

## 2018-07-03 DIAGNOSIS — M722 Plantar fascial fibromatosis: Secondary | ICD-10-CM | POA: Diagnosis not present

## 2018-07-03 DIAGNOSIS — M79671 Pain in right foot: Secondary | ICD-10-CM | POA: Diagnosis not present

## 2018-07-09 DIAGNOSIS — I2510 Atherosclerotic heart disease of native coronary artery without angina pectoris: Secondary | ICD-10-CM | POA: Diagnosis not present

## 2018-07-09 DIAGNOSIS — E7800 Pure hypercholesterolemia, unspecified: Secondary | ICD-10-CM | POA: Diagnosis not present

## 2018-07-24 DIAGNOSIS — Z1331 Encounter for screening for depression: Secondary | ICD-10-CM | POA: Diagnosis not present

## 2018-07-24 DIAGNOSIS — Z1339 Encounter for screening examination for other mental health and behavioral disorders: Secondary | ICD-10-CM | POA: Diagnosis not present

## 2018-07-24 DIAGNOSIS — Z0000 Encounter for general adult medical examination without abnormal findings: Secondary | ICD-10-CM | POA: Diagnosis not present

## 2018-07-24 DIAGNOSIS — N400 Enlarged prostate without lower urinary tract symptoms: Secondary | ICD-10-CM | POA: Diagnosis not present

## 2018-07-24 DIAGNOSIS — Z79899 Other long term (current) drug therapy: Secondary | ICD-10-CM | POA: Diagnosis not present

## 2018-07-24 DIAGNOSIS — Z299 Encounter for prophylactic measures, unspecified: Secondary | ICD-10-CM | POA: Diagnosis not present

## 2018-07-24 DIAGNOSIS — Z125 Encounter for screening for malignant neoplasm of prostate: Secondary | ICD-10-CM | POA: Diagnosis not present

## 2018-07-24 DIAGNOSIS — Z7189 Other specified counseling: Secondary | ICD-10-CM | POA: Diagnosis not present

## 2018-07-24 DIAGNOSIS — Z1211 Encounter for screening for malignant neoplasm of colon: Secondary | ICD-10-CM | POA: Diagnosis not present

## 2018-07-24 DIAGNOSIS — Z23 Encounter for immunization: Secondary | ICD-10-CM | POA: Diagnosis not present

## 2018-07-24 DIAGNOSIS — R5383 Other fatigue: Secondary | ICD-10-CM | POA: Diagnosis not present

## 2018-07-24 DIAGNOSIS — E7800 Pure hypercholesterolemia, unspecified: Secondary | ICD-10-CM | POA: Diagnosis not present

## 2018-07-24 DIAGNOSIS — Z6826 Body mass index (BMI) 26.0-26.9, adult: Secondary | ICD-10-CM | POA: Diagnosis not present

## 2018-07-30 DIAGNOSIS — M79671 Pain in right foot: Secondary | ICD-10-CM | POA: Diagnosis not present

## 2018-07-30 DIAGNOSIS — M722 Plantar fascial fibromatosis: Secondary | ICD-10-CM | POA: Diagnosis not present

## 2018-08-07 DIAGNOSIS — Z299 Encounter for prophylactic measures, unspecified: Secondary | ICD-10-CM | POA: Diagnosis not present

## 2018-08-07 DIAGNOSIS — I2510 Atherosclerotic heart disease of native coronary artery without angina pectoris: Secondary | ICD-10-CM | POA: Diagnosis not present

## 2018-08-07 DIAGNOSIS — Z6825 Body mass index (BMI) 25.0-25.9, adult: Secondary | ICD-10-CM | POA: Diagnosis not present

## 2018-08-07 DIAGNOSIS — Z713 Dietary counseling and surveillance: Secondary | ICD-10-CM | POA: Diagnosis not present

## 2018-08-07 DIAGNOSIS — R252 Cramp and spasm: Secondary | ICD-10-CM | POA: Diagnosis not present

## 2018-08-08 DIAGNOSIS — E7800 Pure hypercholesterolemia, unspecified: Secondary | ICD-10-CM | POA: Diagnosis not present

## 2018-08-08 DIAGNOSIS — I2510 Atherosclerotic heart disease of native coronary artery without angina pectoris: Secondary | ICD-10-CM | POA: Diagnosis not present

## 2018-08-20 DIAGNOSIS — M722 Plantar fascial fibromatosis: Secondary | ICD-10-CM | POA: Diagnosis not present

## 2018-08-20 DIAGNOSIS — M79671 Pain in right foot: Secondary | ICD-10-CM | POA: Diagnosis not present

## 2018-09-10 DIAGNOSIS — M722 Plantar fascial fibromatosis: Secondary | ICD-10-CM | POA: Diagnosis not present

## 2018-09-10 DIAGNOSIS — M79671 Pain in right foot: Secondary | ICD-10-CM | POA: Diagnosis not present

## 2018-09-11 DIAGNOSIS — I2510 Atherosclerotic heart disease of native coronary artery without angina pectoris: Secondary | ICD-10-CM | POA: Diagnosis not present

## 2018-09-11 DIAGNOSIS — H2513 Age-related nuclear cataract, bilateral: Secondary | ICD-10-CM | POA: Diagnosis not present

## 2018-09-11 DIAGNOSIS — E7800 Pure hypercholesterolemia, unspecified: Secondary | ICD-10-CM | POA: Diagnosis not present

## 2018-09-11 DIAGNOSIS — H40033 Anatomical narrow angle, bilateral: Secondary | ICD-10-CM | POA: Diagnosis not present

## 2018-10-21 DIAGNOSIS — I2510 Atherosclerotic heart disease of native coronary artery without angina pectoris: Secondary | ICD-10-CM | POA: Diagnosis not present

## 2018-10-21 DIAGNOSIS — E7800 Pure hypercholesterolemia, unspecified: Secondary | ICD-10-CM | POA: Diagnosis not present

## 2018-11-26 DIAGNOSIS — E7800 Pure hypercholesterolemia, unspecified: Secondary | ICD-10-CM | POA: Diagnosis not present

## 2018-11-26 DIAGNOSIS — I2510 Atherosclerotic heart disease of native coronary artery without angina pectoris: Secondary | ICD-10-CM | POA: Diagnosis not present

## 2018-12-29 DIAGNOSIS — I2510 Atherosclerotic heart disease of native coronary artery without angina pectoris: Secondary | ICD-10-CM | POA: Diagnosis not present

## 2018-12-29 DIAGNOSIS — E7800 Pure hypercholesterolemia, unspecified: Secondary | ICD-10-CM | POA: Diagnosis not present

## 2019-01-12 DIAGNOSIS — M545 Low back pain: Secondary | ICD-10-CM | POA: Diagnosis not present

## 2019-01-12 DIAGNOSIS — M3500 Sicca syndrome, unspecified: Secondary | ICD-10-CM | POA: Diagnosis not present

## 2019-01-12 DIAGNOSIS — M4696 Unspecified inflammatory spondylopathy, lumbar region: Secondary | ICD-10-CM | POA: Diagnosis not present

## 2019-01-12 DIAGNOSIS — M791 Myalgia, unspecified site: Secondary | ICD-10-CM | POA: Diagnosis not present

## 2019-01-12 DIAGNOSIS — E538 Deficiency of other specified B group vitamins: Secondary | ICD-10-CM | POA: Diagnosis not present

## 2019-01-12 DIAGNOSIS — Z6827 Body mass index (BMI) 27.0-27.9, adult: Secondary | ICD-10-CM | POA: Diagnosis not present

## 2019-01-12 DIAGNOSIS — M5136 Other intervertebral disc degeneration, lumbar region: Secondary | ICD-10-CM | POA: Diagnosis not present

## 2019-01-12 DIAGNOSIS — Z299 Encounter for prophylactic measures, unspecified: Secondary | ICD-10-CM | POA: Diagnosis not present

## 2019-01-12 DIAGNOSIS — R252 Cramp and spasm: Secondary | ICD-10-CM | POA: Diagnosis not present

## 2019-01-12 DIAGNOSIS — Z87891 Personal history of nicotine dependence: Secondary | ICD-10-CM | POA: Diagnosis not present

## 2019-01-12 DIAGNOSIS — M4316 Spondylolisthesis, lumbar region: Secondary | ICD-10-CM | POA: Diagnosis not present

## 2019-01-14 DIAGNOSIS — Z6827 Body mass index (BMI) 27.0-27.9, adult: Secondary | ICD-10-CM | POA: Diagnosis not present

## 2019-01-14 DIAGNOSIS — M3500 Sicca syndrome, unspecified: Secondary | ICD-10-CM | POA: Diagnosis not present

## 2019-01-14 DIAGNOSIS — I2510 Atherosclerotic heart disease of native coronary artery without angina pectoris: Secondary | ICD-10-CM | POA: Diagnosis not present

## 2019-01-14 DIAGNOSIS — M5136 Other intervertebral disc degeneration, lumbar region: Secondary | ICD-10-CM | POA: Diagnosis not present

## 2019-01-14 DIAGNOSIS — Z299 Encounter for prophylactic measures, unspecified: Secondary | ICD-10-CM | POA: Diagnosis not present

## 2019-01-14 DIAGNOSIS — E538 Deficiency of other specified B group vitamins: Secondary | ICD-10-CM | POA: Diagnosis not present

## 2019-01-15 DIAGNOSIS — E538 Deficiency of other specified B group vitamins: Secondary | ICD-10-CM | POA: Diagnosis not present

## 2019-01-16 DIAGNOSIS — E538 Deficiency of other specified B group vitamins: Secondary | ICD-10-CM | POA: Diagnosis not present

## 2019-01-19 DIAGNOSIS — E538 Deficiency of other specified B group vitamins: Secondary | ICD-10-CM | POA: Diagnosis not present

## 2019-01-20 DIAGNOSIS — E538 Deficiency of other specified B group vitamins: Secondary | ICD-10-CM | POA: Diagnosis not present

## 2019-01-21 DIAGNOSIS — E538 Deficiency of other specified B group vitamins: Secondary | ICD-10-CM | POA: Diagnosis not present

## 2019-01-22 DIAGNOSIS — Z299 Encounter for prophylactic measures, unspecified: Secondary | ICD-10-CM | POA: Diagnosis not present

## 2019-01-22 DIAGNOSIS — E538 Deficiency of other specified B group vitamins: Secondary | ICD-10-CM | POA: Diagnosis not present

## 2019-01-23 DIAGNOSIS — E538 Deficiency of other specified B group vitamins: Secondary | ICD-10-CM | POA: Diagnosis not present

## 2019-02-13 DIAGNOSIS — Z299 Encounter for prophylactic measures, unspecified: Secondary | ICD-10-CM | POA: Diagnosis not present

## 2019-02-13 DIAGNOSIS — Z6826 Body mass index (BMI) 26.0-26.9, adult: Secondary | ICD-10-CM | POA: Diagnosis not present

## 2019-02-13 DIAGNOSIS — Z713 Dietary counseling and surveillance: Secondary | ICD-10-CM | POA: Diagnosis not present

## 2019-02-13 DIAGNOSIS — E538 Deficiency of other specified B group vitamins: Secondary | ICD-10-CM | POA: Diagnosis not present

## 2019-03-06 DIAGNOSIS — E7800 Pure hypercholesterolemia, unspecified: Secondary | ICD-10-CM | POA: Diagnosis not present

## 2019-03-06 DIAGNOSIS — I2510 Atherosclerotic heart disease of native coronary artery without angina pectoris: Secondary | ICD-10-CM | POA: Diagnosis not present

## 2019-03-23 DIAGNOSIS — Z299 Encounter for prophylactic measures, unspecified: Secondary | ICD-10-CM | POA: Diagnosis not present

## 2019-03-23 DIAGNOSIS — E538 Deficiency of other specified B group vitamins: Secondary | ICD-10-CM | POA: Diagnosis not present

## 2019-03-23 DIAGNOSIS — Z6825 Body mass index (BMI) 25.0-25.9, adult: Secondary | ICD-10-CM | POA: Diagnosis not present

## 2019-03-23 DIAGNOSIS — M3500 Sicca syndrome, unspecified: Secondary | ICD-10-CM | POA: Diagnosis not present

## 2019-03-23 DIAGNOSIS — Z713 Dietary counseling and surveillance: Secondary | ICD-10-CM | POA: Diagnosis not present

## 2019-03-26 DIAGNOSIS — R252 Cramp and spasm: Secondary | ICD-10-CM | POA: Diagnosis not present

## 2019-03-26 DIAGNOSIS — Z299 Encounter for prophylactic measures, unspecified: Secondary | ICD-10-CM | POA: Diagnosis not present

## 2019-03-26 DIAGNOSIS — M545 Low back pain: Secondary | ICD-10-CM | POA: Diagnosis not present

## 2019-03-26 DIAGNOSIS — M5416 Radiculopathy, lumbar region: Secondary | ICD-10-CM | POA: Diagnosis not present

## 2019-03-26 DIAGNOSIS — M3500 Sicca syndrome, unspecified: Secondary | ICD-10-CM | POA: Diagnosis not present

## 2019-03-26 DIAGNOSIS — Z6825 Body mass index (BMI) 25.0-25.9, adult: Secondary | ICD-10-CM | POA: Diagnosis not present

## 2019-03-30 DIAGNOSIS — M545 Low back pain: Secondary | ICD-10-CM | POA: Diagnosis not present

## 2019-03-30 DIAGNOSIS — M48061 Spinal stenosis, lumbar region without neurogenic claudication: Secondary | ICD-10-CM | POA: Diagnosis not present

## 2019-04-20 DIAGNOSIS — Z299 Encounter for prophylactic measures, unspecified: Secondary | ICD-10-CM | POA: Diagnosis not present

## 2019-04-20 DIAGNOSIS — E538 Deficiency of other specified B group vitamins: Secondary | ICD-10-CM | POA: Diagnosis not present

## 2019-04-20 DIAGNOSIS — Z713 Dietary counseling and surveillance: Secondary | ICD-10-CM | POA: Diagnosis not present

## 2019-04-20 DIAGNOSIS — Z6825 Body mass index (BMI) 25.0-25.9, adult: Secondary | ICD-10-CM | POA: Diagnosis not present

## 2019-04-27 DIAGNOSIS — M5416 Radiculopathy, lumbar region: Secondary | ICD-10-CM | POA: Diagnosis not present

## 2019-04-27 DIAGNOSIS — M545 Low back pain: Secondary | ICD-10-CM | POA: Diagnosis not present

## 2019-04-27 DIAGNOSIS — M25552 Pain in left hip: Secondary | ICD-10-CM | POA: Diagnosis not present

## 2019-05-04 DIAGNOSIS — M5416 Radiculopathy, lumbar region: Secondary | ICD-10-CM | POA: Diagnosis not present

## 2019-05-04 DIAGNOSIS — M545 Low back pain: Secondary | ICD-10-CM | POA: Diagnosis not present

## 2019-05-18 DIAGNOSIS — Z299 Encounter for prophylactic measures, unspecified: Secondary | ICD-10-CM | POA: Diagnosis not present

## 2019-05-18 DIAGNOSIS — Z6825 Body mass index (BMI) 25.0-25.9, adult: Secondary | ICD-10-CM | POA: Diagnosis not present

## 2019-05-18 DIAGNOSIS — E538 Deficiency of other specified B group vitamins: Secondary | ICD-10-CM | POA: Diagnosis not present

## 2019-05-18 DIAGNOSIS — Z713 Dietary counseling and surveillance: Secondary | ICD-10-CM | POA: Diagnosis not present

## 2019-05-26 DIAGNOSIS — M5416 Radiculopathy, lumbar region: Secondary | ICD-10-CM | POA: Diagnosis not present

## 2019-05-26 DIAGNOSIS — M545 Low back pain: Secondary | ICD-10-CM | POA: Diagnosis not present

## 2019-06-02 DIAGNOSIS — I2510 Atherosclerotic heart disease of native coronary artery without angina pectoris: Secondary | ICD-10-CM | POA: Diagnosis not present

## 2019-06-02 DIAGNOSIS — E7800 Pure hypercholesterolemia, unspecified: Secondary | ICD-10-CM | POA: Diagnosis not present

## 2019-06-03 DIAGNOSIS — M545 Low back pain: Secondary | ICD-10-CM | POA: Diagnosis not present

## 2019-06-03 DIAGNOSIS — M5416 Radiculopathy, lumbar region: Secondary | ICD-10-CM | POA: Diagnosis not present

## 2019-06-16 DIAGNOSIS — M3500 Sicca syndrome, unspecified: Secondary | ICD-10-CM | POA: Diagnosis not present

## 2019-06-16 DIAGNOSIS — E7800 Pure hypercholesterolemia, unspecified: Secondary | ICD-10-CM | POA: Diagnosis not present

## 2019-06-16 DIAGNOSIS — E538 Deficiency of other specified B group vitamins: Secondary | ICD-10-CM | POA: Diagnosis not present

## 2019-06-16 DIAGNOSIS — Z299 Encounter for prophylactic measures, unspecified: Secondary | ICD-10-CM | POA: Diagnosis not present

## 2019-06-16 DIAGNOSIS — Z6826 Body mass index (BMI) 26.0-26.9, adult: Secondary | ICD-10-CM | POA: Diagnosis not present

## 2019-06-25 DIAGNOSIS — M545 Low back pain: Secondary | ICD-10-CM | POA: Diagnosis not present

## 2019-06-25 DIAGNOSIS — M5416 Radiculopathy, lumbar region: Secondary | ICD-10-CM | POA: Diagnosis not present

## 2019-07-01 ENCOUNTER — Ambulatory Visit: Payer: Medicare Other | Attending: Physical Medicine and Rehabilitation | Admitting: Physical Therapy

## 2019-07-01 ENCOUNTER — Other Ambulatory Visit: Payer: Self-pay

## 2019-07-01 ENCOUNTER — Encounter: Payer: Self-pay | Admitting: Physical Therapy

## 2019-07-01 DIAGNOSIS — R293 Abnormal posture: Secondary | ICD-10-CM | POA: Diagnosis not present

## 2019-07-01 DIAGNOSIS — M545 Low back pain: Secondary | ICD-10-CM | POA: Diagnosis not present

## 2019-07-01 DIAGNOSIS — G8929 Other chronic pain: Secondary | ICD-10-CM | POA: Insufficient documentation

## 2019-07-01 NOTE — Therapy (Signed)
[facility] [place], [place], [address] Phone: [phone]   Fax:  [phone]  Physical Therapy Evaluation  Patient Details  Name: Dalton Phillips MRN: [id number] Date of Birth: 19-Aug-1941 Referring Provider (PT): Laroy Apple, MD   Encounter Date: 07/01/2019  PT End of Session - 07/01/19 1423    Visit Number  1    Number of Visits  12    Date for PT Re-Evaluation  08/19/19    Authorization Type  FOTO; progress note every 10th visit; KX modifier every 15th visit    PT Start Time  0900    PT Stop Time  0943    PT Time Calculation (min)  43 min    Activity Tolerance  Patient tolerated treatment well    Behavior During Therapy  [facility] for tasks assessed/performed       Past Medical History:  Diagnosis Date  . CAD (coronary artery disease)    LAD disease,otherwise nonobstructive,LVEF normal  . Herpes zoster   . Hyperlipidemia   . Myocardial infarction [facility])    NSTEMI 05/10    Past Surgical History:  Procedure Laterality Date  . CORONARY ARTERY BYPASS GRAFT  04-04-09   Bryan Bartle,MD-off pump LIMA to LAD    There were no vitals filed for this visit.   Subjective Assessment - 07/01/19 1417    Subjective  COVID-19 screening performed upon arrival.Patient arrives to physical therapy with reports of ongoing low back pain that exacerbated in the past two months. Patient reports pain comes and goes and is unsure of what activities cause an increase of pain. Patient reports receiving injections for the spine with some relief. Patient reports he is able to complete all ADLs but with increased time. Patient denies pain with golfing but does intermittently report pain with getting in/out of the golf cart. Patient reports pain at worst is 10/10 and pain at best is 0/10 with reports of stiffness. Patient's goals are to decrease pain, improve ability to perform home activities, and return to PLOF.    Limitations  Walking;House  hold activities;Standing    Diagnostic tests  X-ray & MRI: unsure of results "a lot of spine problems"    Patient Stated Goals  stop pain, do activities better    Currently in Pain?  Yes    Pain Score  3     Pain Location  Back    Pain Orientation  Right    Pain Descriptors / Indicators  Sore;Tightness    Pain Type  Chronic pain    Pain Onset  More than a month ago    Pain Frequency  Intermittent    Aggravating Factors   "unsure, comes and goes"    Pain Relieving Factors  "unsure"    Effect of Pain on Daily Activities  difficulties with some daily activities         [facility] PT Assessment - 07/01/19 0001      Assessment   Medical Diagnosis  right LBP, facet mediated pain with lumbar spondylosis    Referring Provider (PT)  Laroy Apple, MD    Onset Date/Surgical Date  --   ongoing   Next MD Visit  "2 weeks"    Prior Therapy  no      Precautions   Precautions  None      Restrictions   Weight Bearing Restrictions  No      Balance Screen   Has the patient fallen in the past 6 months  No  Has the patient had a decrease in activity level because of a fear of falling?   No    Is the patient reluctant to leave their home because of a fear of falling?   No      Home Film/video editor residence      Prior Function   Leisure  playing golf      Observation/Other Assessments   Focus on Therapeutic Outcomes (FOTO)   to be completed next visit      Coordination   Gross Motor Movements are Fluid and Coordinated  --      Posture/Postural Control   Posture/Postural Control  Postural limitations    Postural Limitations  Rounded Shoulders;Forward head;Decreased thoracic kyphosis;Flexed trunk      ROM / Strength   AROM / PROM / Strength  AROM;Strength      AROM   AROM Assessment Site  Lumbar    Lumbar Flexion  16.5" finger tip to floor    Lumbar Extension  5 degrees    Lumbar - Right Side Bend  20" finger tip to floor    Lumbar - Left Side Bend  22"  finger tip to floor      Strength   Strength Assessment Site  Knee;Hip    Right/Left Hip  Right;Left    Right Hip Flexion  4+/5    Right Hip Extension  3+/5    Right Hip ABduction  3+/5    Left Hip Flexion  4-/5    Left Hip Extension  3+/5    Left Hip ABduction  3+/5    Right/Left Knee  Right;Left    Right Knee Flexion  4+/5    Right Knee Extension  4+/5    Left Knee Flexion  4+/5    Left Knee Extension  4+/5      Palpation   SI assessment   equal leg lengths and ASIS    Palpation comment  increased muscle tension to bilateral QLs increased tenderness to R>L      Transfers   Comments  requires increased time for transfers      Ambulation/Gait   Gait Pattern  Step-through pattern;Decreased stride length;Trunk flexed;Trunk rotated posteriorly on left                Objective measurements completed on examination: See above findings.              PT Education - 07/01/19 1421    Education Details  draw ins, supine marching, single knee to chest, lower trunk rotations    Person(s) Educated  Patient    Methods  Explanation;Demonstration;Handout    Comprehension  Verbalized understanding;Returned demonstration          PT Long Term Goals - 07/01/19 1604      PT LONG TERM GOAL #1   Title  Patient will be independent with HEP    Time  6    Period  Weeks    Status  New      PT LONG TERM GOAL #2   Title  Patient will demonstrate 4/5 or greater bilateral hip MMT to improve stability during functional tasks.    Time  6    Period  Weeks    Status  New      PT LONG TERM GOAL #3   Title  Patient will report ability to perform ADLs with low back pain less than or equal to 3/10    Time  6  Period  Weeks    Status  New      PT LONG TERM GOAL #4   Title  Patient will report ability to transition from golf cart with low back pain less than or equal to 3/10    Time  6    Period  Weeks    Status  New             Plan - 07/01/19 1427     Clinical Impression Statement  Patient is a 78 year old male who presents to physical therapy with decreased lumbar AROM, decreased hip MMT bilaterally, and increased muscle tension in bilateral QLs and lumbar paraspinals. 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 Patient transitions with increased time secondary to pain. Patient and PT reviewed HEP and discussed importance of performing to maximize PT. Patient reported understanding. Patient would benefit from skilled physical therapy to address deficits and address patient's goals.    Personal Factors and Comorbidities  Age;Time since onset of injury/illness/exacerbation    Examination-Activity Limitations  Stand;Transfers    Stability/Clinical Decision Making  Stable/Uncomplicated    Clinical Decision Making  Low    Rehab Potential  Good    PT Frequency  2x / week    PT Duration  6 weeks    PT Treatment/Interventions  ADLs/Self Care Home Management;Electrical Stimulation;Cryotherapy;Therapeutic activities;Functional mobility training;Therapeutic exercise;Balance training;Gait training;Stair training;Moist Heat;Neuromuscular re-education;Patient/family education;Dry needling;Passive range of motion;Taping;Spinal Manipulations;Ultrasound    PT Next Visit Plan  Nustep, core stability and stabilization, modalities PRN for pain relief.    PT Home Exercise Plan  see patient education section    Consulted and Agree with Plan of Care  Patient       Patient will benefit from skilled therapeutic intervention in order to improve the following deficits and impairments:  Decreased balance, Pain, Postural dysfunction, Decreased range of motion, Decreased strength, Difficulty walking  Visit Diagnosis: 1. Chronic bilateral low back pain, unspecified whether sciatica present   2. Abnormal posture        Problem List Patient Active Problem List   Diagnosis Date Noted  . 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 MIXED HYPERLIPIDEMIA  06/03/2009  . CORONARY ATHEROSCLEROSIS NATIVE CORONARY ARTERY 06/03/2009   Gabriela Eves, PT, DPT 07/01/2019, 4:06 PM  [facility] [address] [place], [place], [address] Phone: [phone]   Fax:  [phone]  Name: Dalton Phillips MRN: [id number] Date of Birth: 1941/02/12

## 2019-07-02 ENCOUNTER — Ambulatory Visit: Payer: Medicare Other | Admitting: Physical Therapy

## 2019-07-02 ENCOUNTER — Encounter: Payer: Self-pay | Admitting: Physical Therapy

## 2019-07-02 ENCOUNTER — Other Ambulatory Visit: Payer: Self-pay

## 2019-07-02 DIAGNOSIS — G8929 Other chronic pain: Secondary | ICD-10-CM | POA: Diagnosis not present

## 2019-07-02 DIAGNOSIS — R293 Abnormal posture: Secondary | ICD-10-CM | POA: Diagnosis not present

## 2019-07-02 DIAGNOSIS — M545 Low back pain: Secondary | ICD-10-CM | POA: Diagnosis not present

## 2019-07-02 NOTE — Therapy (Signed)
[facility] [place], [place], [address] Phone: [phone]   Fax:  [phone]  Physical Therapy Treatment  Patient Details  Name: Dalton Phillips MRN: [id number] Date of Birth: 1941/04/19 Referring Provider (PT): Laroy Apple, MD   Encounter Date: 07/02/2019  PT End of Session - 07/02/19 0944    Visit Number  2    Number of Visits  12    Date for PT Re-Evaluation  08/19/19    Authorization Type  FOTO; progress note every 10th visit; KX modifier every 15th visit    PT Start Time  0900    PT Stop Time  0948    PT Time Calculation (min)  48 min    Activity Tolerance  Patient tolerated treatment well    Behavior During Therapy  [facility] for tasks assessed/performed       Past Medical History:  Diagnosis Date  . CAD (coronary artery disease)    LAD disease,otherwise nonobstructive,LVEF normal  . Herpes zoster   . Hyperlipidemia   . Myocardial infarction [facility])    NSTEMI 05/10    Past Surgical History:  Procedure Laterality Date  . CORONARY ARTERY BYPASS GRAFT  04-04-09   Bryan Bartle,MD-off pump LIMA to LAD    There were no vitals filed for this visit.  Subjective Assessment - 07/02/19 0912    Subjective  COVID-19 screening performed upon arrival. Patient arrives stating he felt good sitting up in bed but coming to standing caused an increase of pain.    Limitations  Walking;House hold activities;Standing    Diagnostic tests  X-ray & MRI: unsure of results "a lot of spine problems"    Patient Stated Goals  stop pain, do activities better    Currently in Pain?  Yes   did not provide number on pain scale        [facility] PT Assessment - 07/02/19 0001      Assessment   Medical Diagnosis  right LBP, facet mediated pain with lumbar spondylosis    Referring Provider (PT)  Laroy Apple, MD    Next MD Visit  "2 weeks"      Precautions   Precautions  None                   [facility] Adult PT  Treatment/Exercise - 07/02/19 0001      Exercises   Exercises  Lumbar      Lumbar Exercises: Aerobic   Nustep  level 4 x10 mins      Lumbar Exercises: Standing   Other Standing Lumbar Exercises  rockerboard x3 minutes      Modalities   Modalities  Electrical Stimulation;Moist Heat      Moist Heat Therapy   Number Minutes Moist Heat  10 Minutes    Moist Heat Location  Lumbar Spine      Electrical Stimulation   Electrical Stimulation Location  right QL    Electrical Stimulation Action  pre-mod    Electrical Stimulation Parameters  80-150 hz x10 mins    Electrical Stimulation Goals  Pain      Manual Therapy   Manual Therapy  Soft tissue mobilization    Soft tissue mobilization  STW/M to right QL and lumbar paraspinals to decrease tone                  PT Long Term Goals - 07/01/19 1604      PT LONG TERM GOAL #1   Title  Patient will  be independent with HEP    Time  6    Period  Weeks    Status  New      PT LONG TERM GOAL #2   Title  Patient will demonstrate 4/5 or greater bilateral hip MMT to improve stability during functional tasks.    Time  6    Period  Weeks    Status  New      PT LONG TERM GOAL #3   Title  Patient will report ability to perform ADLs with low back pain less than or equal to 3/10    Time  6    Period  Weeks    Status  New      PT LONG TERM GOAL #4   Title  Patient will report ability to transition from golf cart with low back pain less than or equal to 3/10    Time  6    Period  Weeks    Status  New            Plan - 07/02/19 1003    Clinical Impression Statement  Patient arrives with moderate pain particularly in the right QL. Patient guided through TEs to which patient required intermittent cuing for form and technique. Patient note with increased right QL and lumbar paraspinal tone to which responded well to gentle STW/M. Patient's FOTO limitation 44%. No adverse affects noted upon removal of modalities.    Personal  Factors and Comorbidities  Age;Time since onset of injury/illness/exacerbation    Examination-Activity Limitations  Stand;Transfers    Stability/Clinical Decision Making  Stable/Uncomplicated    Clinical Decision Making  Low    Rehab Potential  Good    PT Frequency  2x / week    PT Duration  6 weeks    PT Treatment/Interventions  ADLs/Self Care Home Management;Electrical Stimulation;Cryotherapy;Therapeutic activities;Functional mobility training;Therapeutic exercise;Balance training;Gait training;Stair training;Moist Heat;Neuromuscular re-education;Patient/family education;Dry needling;Passive range of motion;Taping;Spinal Manipulations;Ultrasound    PT Next Visit Plan  Nustep, core stability and stabilization, modalities PRN for pain relief.    PT Home Exercise Plan  see patient education section    Consulted and Agree with Plan of Care  Patient       Patient will benefit from skilled therapeutic intervention in order to improve the following deficits and impairments:  Decreased balance, Pain, Postural dysfunction, Decreased range of motion, Decreased strength, Difficulty walking  Visit Diagnosis: 1. Chronic bilateral low back pain, unspecified whether sciatica present   2. Abnormal posture        Problem List Patient Active Problem List   Diagnosis Date Noted  . 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 CORONARY ATHEROSCLEROSIS NATIVE CORONARY ARTERY 06/03/2009    Gabriela Eves, PT, DPT 07/02/2019, 10:08 AM  [facility] [place], [place], [address] Phone: [phone]   Fax:  [phone]  Name: Dalton Phillips MRN: [id number] Date of Birth: 07-Nov-1941

## 2019-07-06 ENCOUNTER — Encounter: Payer: Self-pay | Admitting: Physical Therapy

## 2019-07-06 ENCOUNTER — Other Ambulatory Visit: Payer: Self-pay

## 2019-07-06 ENCOUNTER — Ambulatory Visit: Payer: Medicare Other | Admitting: Physical Therapy

## 2019-07-06 DIAGNOSIS — R293 Abnormal posture: Secondary | ICD-10-CM

## 2019-07-06 DIAGNOSIS — G8929 Other chronic pain: Secondary | ICD-10-CM | POA: Diagnosis not present

## 2019-07-06 DIAGNOSIS — M545 Low back pain: Secondary | ICD-10-CM | POA: Diagnosis not present

## 2019-07-06 NOTE — Therapy (Signed)
[facility] [place], [place], [address] Phone: [phone]   Fax:  [phone]  Physical Therapy Treatment  Patient Details  Name: Dalton Phillips MRN: [id number] Date of Birth: [birth year] Referring Provider (PT): Laroy Apple, MD   Encounter Date: 07/06/2019  PT End of Session - 07/06/19 1348    Visit Number  3    Number of Visits  12    Date for PT Re-Evaluation  08/19/19    Authorization Type  FOTO; progress note every 10th visit; KX modifier every 15th visit    PT Start Time  1346    PT Stop Time  1431    PT Time Calculation (min)  45 min    Activity Tolerance  Patient tolerated treatment well    Behavior During Therapy  [facility] for tasks assessed/performed       Past Medical History:  Diagnosis Date  . CAD (coronary artery disease)    LAD disease,otherwise nonobstructive,LVEF normal  . Herpes zoster   . Hyperlipidemia   . Myocardial infarction [facility])    NSTEMI 05/10    Past Surgical History:  Procedure Laterality Date  . CORONARY ARTERY BYPASS GRAFT  04-04-09   Bryan Bartle,MD-off pump LIMA to LAD    There were no vitals filed for this visit.  Subjective Assessment - 07/06/19 1347    Subjective  COVID-19 screening performed upon arrival. Patient arrives stating not much pain but stiffness in low back rated at a 6/10.    Limitations  Walking;House hold activities;Standing    Diagnostic tests  X-ray & MRI: unsure of results "a lot of spine problems"    Patient Stated Goals  stop pain, do activities better    Currently in Pain?  Yes    Pain Score  6    "stiffness not pain"        [facility] PT Assessment - 07/06/19 0001      Assessment   Medical Diagnosis  right LBP, facet mediated pain with lumbar spondylosis    Referring Provider (PT)  Laroy Apple, MD    Next MD Visit  "2 weeks"      Precautions   Precautions  None                   [facility] Adult PT Treatment/Exercise - 07/06/19 0001       Lumbar Exercises: Stretches   Single Knee to Chest Stretch  3 reps;30 seconds      Lumbar Exercises: Aerobic   Nustep  level 4 x12 mins      Lumbar Exercises: Supine   Glut Set  15 reps;3 seconds    Clam  20 reps;2 seconds    Clam Limitations  red theraband    Bridge  20 reps;2 seconds    Bridge Limitations  attempted but unable to lift off    Straight Leg Raise  20 reps;2 seconds    Other Supine Lumbar Exercises  hip adduction ball squeeze 5" hold x15      Modalities   Modalities  Electrical Stimulation;Moist Heat      Moist Heat Therapy   Number Minutes Moist Heat  10 Minutes    Moist Heat Location  Lumbar Spine      Electrical Stimulation   Electrical Stimulation Location  bilateral low back    Electrical Stimulation Action  pre-mod    Electrical Stimulation Parameters  80-150 hz x10 mins    Electrical Stimulation Goals  Pain  PT Long Term Goals - 07/01/19 1604      PT LONG TERM GOAL #1   Title  Patient will be independent with HEP    Time  6    Period  Weeks    Status  New      PT LONG TERM GOAL #2   Title  Patient will demonstrate 4/5 or greater bilateral hip MMT to improve stability during functional tasks.    Time  6    Period  Weeks    Status  New      PT LONG TERM GOAL #3   Title  Patient will report ability to perform ADLs with low back pain less than or equal to 3/10    Time  6    Period  Weeks    Status  New      PT LONG TERM GOAL #4   Title  Patient will report ability to transition from golf cart with low back pain less than or equal to 3/10    Time  6    Period  Weeks    Status  New            Plan - 07/06/19 1348    Clinical Impression Statement  Patient arrives with ongoing "stiffness" in bilateral low back. Patient required intermittnet cuing for form and for proper hold times. Patient discussed continuing HEP to maximize PT benefits; patient reported understanding. No adverse affects noted upon removal  of modaities.    Personal Factors and Comorbidities  Age;Time since onset of injury/illness/exacerbation    Examination-Activity Limitations  Stand;Transfers    Stability/Clinical Decision Making  Stable/Uncomplicated    Clinical Decision Making  Low    Rehab Potential  Good    PT Frequency  2x / week    PT Duration  6 weeks    PT Treatment/Interventions  ADLs/Self Care Home Management;Electrical Stimulation;Cryotherapy;Therapeutic activities;Functional mobility training;Therapeutic exercise;Balance training;Gait training;Stair training;Moist Heat;Neuromuscular re-education;Patient/family education;Dry needling;Passive range of motion;Taping;Spinal Manipulations;Ultrasound    PT Next Visit Plan  Nustep, core stability and stabilization, modalities PRN for pain relief.    Consulted and Agree with Plan of Care  Patient       Patient will benefit from skilled therapeutic intervention in order to improve the following deficits and impairments:  Decreased balance, Pain, Postural dysfunction, Decreased range of motion, Decreased strength, Difficulty walking  Visit Diagnosis: 1. Chronic bilateral low back pain, unspecified whether sciatica present   2. Abnormal posture        Problem List Patient Active Problem List   Diagnosis Date Noted  . 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 CORONARY ATHEROSCLEROSIS NATIVE CORONARY ARTERY 06/03/2009   Gabriela Eves, PT, DPT 07/06/2019, 2:32 PM  [facility] [place], [place], [address] Phone: [phone]   Fax:  [phone]  Name: FOUAD TAUL MRN: [id number] Date of Birth: [birth year]

## 2019-07-08 DIAGNOSIS — E7800 Pure hypercholesterolemia, unspecified: Secondary | ICD-10-CM | POA: Diagnosis not present

## 2019-07-08 DIAGNOSIS — I2510 Atherosclerotic heart disease of native coronary artery without angina pectoris: Secondary | ICD-10-CM | POA: Diagnosis not present

## 2019-07-09 ENCOUNTER — Other Ambulatory Visit: Payer: Self-pay

## 2019-07-09 ENCOUNTER — Ambulatory Visit: Payer: Medicare Other | Admitting: Physical Therapy

## 2019-07-09 DIAGNOSIS — M545 Low back pain, unspecified: Secondary | ICD-10-CM

## 2019-07-09 DIAGNOSIS — G8929 Other chronic pain: Secondary | ICD-10-CM

## 2019-07-09 DIAGNOSIS — R293 Abnormal posture: Secondary | ICD-10-CM

## 2019-07-09 NOTE — Therapy (Signed)
[facility] [place], [place], [address] Phone: [phone]   Fax:  [phone]  Physical Therapy Treatment  Patient Details  Name: Dalton Phillips MRN: [id number] Date of Birth: January 24, 1941 Referring Provider (PT): Laroy Apple, MD   Encounter Date: 07/09/2019  PT End of Session - 07/09/19 0924    Visit Number  4    Number of Visits  12    Date for PT Re-Evaluation  08/19/19    Authorization Type  FOTO; progress note every 10th visit; KX modifier every 15th visit    PT Start Time  0900    PT Stop Time  0943    PT Time Calculation (min)  43 min    Activity Tolerance  Patient tolerated treatment well    Behavior During Therapy  [facility] for tasks assessed/performed       Past Medical History:  Diagnosis Date  . CAD (coronary artery disease)    LAD disease,otherwise nonobstructive,LVEF normal  . Herpes zoster   . Hyperlipidemia   . Myocardial infarction [facility])    NSTEMI 05/10    Past Surgical History:  Procedure Laterality Date  . CORONARY ARTERY BYPASS GRAFT  04-04-09   Bryan Bartle,MD-off pump LIMA to LAD    There were no vitals filed for this visit.  Subjective Assessment - 07/09/19 0901    Subjective  COVID-19 screening performed upon arrival. Patient arrives with stiffness in back    Limitations  Walking;House hold activities;Standing    Diagnostic tests  X-ray & MRI: unsure of results "a lot of spine problems"    Patient Stated Goals  stop pain, do activities better    Currently in Pain?  Yes    Pain Score  3     Pain Location  Back    Pain Orientation  Right    Pain Descriptors / Indicators  Tightness    Pain Type  Chronic pain    Pain Onset  More than a month ago    Pain Frequency  Intermittent    Aggravating Factors   certain movements    Pain Relieving Factors  hest and rest                       [facility] Adult PT Treatment/Exercise - 07/09/19 0001      Exercises   Exercises   Knee/Hip;Lumbar      Lumbar Exercises: Aerobic   Nustep  level 4 x10 mins UE/LE, core activation       Lumbar Exercises: Seated   Other Seated Lumbar Exercises  seated core activation 3sec hold x20    Other Seated Lumbar Exercises  seated heel lift and marching 2x10 with core focus      Lumbar Exercises: Supine   Other Supine Lumbar Exercises  seated ball squeeze and hip abd with red t-band x20 each      Knee/Hip Exercises: Seated   Other Seated Knee/Hip Exercises  seated rows and ext with yellow t-band x 10 each    Sit to Sand  10 reps;with UE support      Moist Heat Therapy   Number Minutes Moist Heat  10 Minutes    Moist Heat Location  Lumbar Spine      Electrical Stimulation   Electrical Stimulation Location  bilateral low back    Electrical Stimulation Action  premod    Electrical Stimulation Parameters  80-150hz  x69min    Electrical Stimulation Goals  Pain  PT Long Term Goals - 07/09/19 8101      PT LONG TERM GOAL #1   Title  Patient will be independent with HEP    Time  6    Period  Weeks    Status  On-going      PT LONG TERM GOAL #2   Title  Patient will demonstrate 4/5 or greater bilateral hip MMT to improve stability during functional tasks.    Time  6    Period  Weeks    Status  On-going      PT LONG TERM GOAL #3   Title  Patient will report ability to perform ADLs with low back pain less than or equal to 3/10    Time  6    Period  Weeks    Status  On-going      PT LONG TERM GOAL #4   Title  Patient will report ability to transition from golf cart with low back pain less than or equal to 3/10    Time  6    Period  Weeks    Status  On-going            Plan - 07/09/19 0926    Clinical Impression Statement  Patient tolerated treatment well today. Today progressed with core activation exercises and strengthening today. Patient has reported less pain and ongoing stiffness esp with getting in/out of car. Patient current  goals progressing.    Personal Factors and Comorbidities  Age;Time since onset of injury/illness/exacerbation    Examination-Activity Limitations  Stand;Transfers    Stability/Clinical Decision Making  Stable/Uncomplicated    Rehab Potential  Good    PT Frequency  2x / week    PT Duration  6 weeks    PT Treatment/Interventions  ADLs/Self Care Home Management;Electrical Stimulation;Cryotherapy;Therapeutic activities;Functional mobility training;Therapeutic exercise;Balance training;Gait training;Stair training;Moist Heat;Neuromuscular re-education;Patient/family education;Dry needling;Passive range of motion;Taping;Spinal Manipulations;Ultrasound    PT Next Visit Plan  Nustep, core stability and stabilization, modalities PRN for pain relief.    Consulted and Agree with Plan of Care  Patient       Patient will benefit from skilled therapeutic intervention in order to improve the following deficits and impairments:  Decreased balance, Pain, Postural dysfunction, Decreased range of motion, Decreased strength, Difficulty walking  Visit Diagnosis: 1. Chronic bilateral low back pain, unspecified whether sciatica present   2. Abnormal posture        Problem List Patient Active Problem List   Diagnosis Date Noted  . 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 CORONARY ATHEROSCLEROSIS NATIVE CORONARY ARTERY 06/03/2009    [name], PTA 07/09/2019, 9:46 AM  [facility] [place], [place], [address] Phone: [phone]   Fax:  [phone]  Name: Dalton Phillips MRN: [id number] Date of Birth: 1941-06-27

## 2019-07-14 ENCOUNTER — Other Ambulatory Visit: Payer: Self-pay

## 2019-07-14 ENCOUNTER — Ambulatory Visit: Payer: Medicare Other | Admitting: *Deleted

## 2019-07-14 DIAGNOSIS — M545 Low back pain, unspecified: Secondary | ICD-10-CM

## 2019-07-14 DIAGNOSIS — G8929 Other chronic pain: Secondary | ICD-10-CM | POA: Diagnosis not present

## 2019-07-14 DIAGNOSIS — R293 Abnormal posture: Secondary | ICD-10-CM

## 2019-07-14 NOTE — Therapy (Signed)
[facility] [place], [place], [address] Phone: [phone]   Fax:  [phone]  Physical Therapy Treatment  Patient Details  Name: Dalton Phillips MRN: [id number] Date of Birth: [birth year] Referring Provider (PT): Laroy Apple, MD   Encounter Date: 07/14/2019  PT End of Session - 07/14/19 [id number]    Visit Number  5    Number of Visits  12    Date for PT Re-Evaluation  08/19/19    Authorization Type  FOTO; progress note every 10th visit; KX modifier every 15th visit    PT Start Time  1645    PT Stop Time  1732    PT Time Calculation (min)  47 min       Past Medical History:  Diagnosis Date  . CAD (coronary artery disease)    LAD disease,otherwise nonobstructive,LVEF normal  . Herpes zoster   . Hyperlipidemia   . Myocardial infarction [facility])    NSTEMI 05/10    Past Surgical History:  Procedure Laterality Date  . CORONARY ARTERY BYPASS GRAFT  04-04-09   Bryan Bartle,MD-off pump LIMA to LAD    There were no vitals filed for this visit.  Subjective Assessment - 07/14/19 1646    Subjective  COVID-19 screening performed upon arrival. Patient arrives with stiffness in back. 4/10 LBP today. Skip Exs today, I don't think they are helping    Limitations  Walking;House hold activities;Standing    Diagnostic tests  X-ray & MRI: unsure of results "a lot of spine problems"    Patient Stated Goals  stop pain, do activities better    Currently in Pain?  Yes    Pain Score  4     Pain Location  Back    Pain Orientation  Right    Pain Descriptors / Indicators  Tightness    Pain Type  Chronic pain    Pain Onset  More than a month ago                       [facility] Adult PT Treatment/Exercise - 07/14/19 0001      Modalities   Modalities  Electrical Stimulation;Moist Heat;Ultrasound      Moist Heat Therapy   Number Minutes Moist Heat  15 Minutes    Moist Heat Location  Lumbar Spine      Electrical  Stimulation   Electrical Stimulation Location  RT side LB    Electrical Stimulation Action  premod    Electrical Stimulation Parameters  80-150hz  x 15 mins    Electrical Stimulation Goals  Pain      Ultrasound   Ultrasound Location  LB paras    Ultrasound Parameters  1.5 w/cm2 x 10 mins    Ultrasound Goals  Pain      Manual Therapy   Manual Therapy  Soft tissue mobilization    Soft tissue mobilization  STW/M to right QL and Bil.  lumbar paraspinals to decrease tone                  PT Long Term Goals - 07/09/19 [id number]      PT LONG TERM GOAL #1   Title  Patient will be independent with HEP    Time  6    Period  Weeks    Status  On-going      PT LONG TERM GOAL #2   Title  Patient will demonstrate 4/5 or greater bilateral hip MMT to improve stability during functional  tasks.    Time  6    Period  Weeks    Status  On-going      PT LONG TERM GOAL #3   Title  Patient will report ability to perform ADLs with low back pain less than or equal to 3/10    Time  6    Period  Weeks    Status  On-going      PT LONG TERM GOAL #4   Title  Patient will report ability to transition from golf cart with low back pain less than or equal to 3/10    Time  6    Period  Weeks    Status  On-going            Plan - 07/14/19 1645    Clinical Impression Statement  Pt arrived today doing fair, but asked to skip exs because he doesn't feel like they are helping. [place] combo and STW were performed and tolerated well. He had notable tightness in Bil. paras and RT QL. Normal modality response end of Rx.    Examination-Activity Limitations  Stand;Transfers    Stability/Clinical Decision Making  Stable/Uncomplicated    Rehab Potential  Good    PT Frequency  2x / week    PT Duration  6 weeks    PT Treatment/Interventions  ADLs/Self Care Home Management;Electrical Stimulation;Cryotherapy;Therapeutic activities;Functional mobility training;Therapeutic exercise;Balance training;Gait  training;Stair training;Moist Heat;Neuromuscular re-education;Patient/family education;Dry needling;Passive range of motion;Taping;Spinal Manipulations;Ultrasound    PT Next Visit Plan  Nustep, core stability and stabilization, modalities PRN for pain relief.        Assess Pt's response to last Rx    PT Home Exercise Plan  see patient education section    Consulted and Agree with Plan of Care  Patient       Patient will benefit from skilled therapeutic intervention in order to improve the following deficits and impairments:  Decreased balance, Pain, Postural dysfunction, Decreased range of motion, Decreased strength, Difficulty walking  Visit Diagnosis: Chronic bilateral low back pain, unspecified whether sciatica present  Abnormal posture     Problem List Patient Active Problem List   Diagnosis Date Noted  . 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 CORONARY ATHEROSCLEROSIS NATIVE CORONARY ARTERY 06/03/2009    RAMSEUR,CHRIS, PTA 07/14/2019, 5:44 PM  [facility] [address] [place], [place], [address] Phone: [phone]   Fax:  [phone]  Name: Dalton Phillips MRN: [id number] Date of Birth: 29-Aug-1941

## 2019-07-15 DIAGNOSIS — E538 Deficiency of other specified B group vitamins: Secondary | ICD-10-CM | POA: Diagnosis not present

## 2019-07-15 DIAGNOSIS — M3500 Sicca syndrome, unspecified: Secondary | ICD-10-CM | POA: Diagnosis not present

## 2019-07-15 DIAGNOSIS — Z713 Dietary counseling and surveillance: Secondary | ICD-10-CM | POA: Diagnosis not present

## 2019-07-15 DIAGNOSIS — Z299 Encounter for prophylactic measures, unspecified: Secondary | ICD-10-CM | POA: Diagnosis not present

## 2019-07-15 DIAGNOSIS — Z6826 Body mass index (BMI) 26.0-26.9, adult: Secondary | ICD-10-CM | POA: Diagnosis not present

## 2019-07-16 ENCOUNTER — Ambulatory Visit: Payer: Medicare Other | Admitting: *Deleted

## 2019-07-16 ENCOUNTER — Other Ambulatory Visit: Payer: Self-pay

## 2019-07-16 DIAGNOSIS — G8929 Other chronic pain: Secondary | ICD-10-CM

## 2019-07-16 DIAGNOSIS — R293 Abnormal posture: Secondary | ICD-10-CM

## 2019-07-16 DIAGNOSIS — M545 Low back pain: Secondary | ICD-10-CM | POA: Diagnosis not present

## 2019-07-16 NOTE — Therapy (Signed)
[facility] [place], [place], [address] Phone: [phone]   Fax:  [phone]  Physical Therapy Treatment  Patient Details  Name: Dalton Phillips MRN: [id number] Date of Birth: Sep 21, 1941 Referring Provider (PT): Laroy Apple, MD   Encounter Date: 07/16/2019  PT End of Session - 07/16/19 1646    Visit Number  6    Number of Visits  12    Date for PT Re-Evaluation  08/19/19    Authorization Type  FOTO; progress note every 10th visit; KX modifier every 15th visit    PT Start Time  1600    PT Stop Time  1648    PT Time Calculation (min)  48 min       Past Medical History:  Diagnosis Date  . CAD (coronary artery disease)    LAD disease,otherwise nonobstructive,LVEF normal  . Herpes zoster   . Hyperlipidemia   . Myocardial infarction [facility])    NSTEMI 05/10    Past Surgical History:  Procedure Laterality Date  . CORONARY ARTERY BYPASS GRAFT  04-04-09   Bryan Bartle,MD-off pump LIMA to LAD    There were no vitals filed for this visit.  Subjective Assessment - 07/16/19 1601    Subjective  COVID-19 screening performed upon arrival. Patient arrives with stiffness in back. 4/10 LBP today. Did much better after last Rx    Limitations  Walking;House hold activities;Standing    Diagnostic tests  X-ray & MRI: unsure of results "a lot of spine problems"    Patient Stated Goals  stop pain, do activities better    Currently in Pain?  Yes    Pain Score  4     Pain Location  Back    Pain Orientation  Right    Pain Descriptors / Indicators  Tightness    Pain Type  Chronic pain    Pain Onset  More than a month ago                       [facility] Adult PT Treatment/Exercise - 07/16/19 0001      Modalities   Modalities  Electrical Stimulation;Moist Heat;Ultrasound      Moist Heat Therapy   Number Minutes Moist Heat  15 Minutes    Moist Heat Location  Lumbar Spine      Electrical Stimulation   Electrical  Stimulation Location  RT side LB premod 80-150hz  x 15 mins    Electrical Stimulation Goals  Pain      Ultrasound   Ultrasound Location  LB paras    Ultrasound Parameters  1.5 w/cm2 x 12 mins    Ultrasound Goals  Pain      Manual Therapy   Manual Therapy  Soft tissue mobilization    Soft tissue mobilization  STW/M to right QL and Bil.  lumbar paraspinals to decrease tone                  PT Long Term Goals - 07/09/19 [id number]      PT LONG TERM GOAL #1   Title  Patient will be independent with HEP    Time  6    Period  Weeks    Status  On-going      PT LONG TERM GOAL #2   Title  Patient will demonstrate 4/5 or greater bilateral hip MMT to improve stability during functional tasks.    Time  6    Period  Weeks    Status  On-going      PT LONG TERM GOAL #3   Title  Patient will report ability to perform ADLs with low back pain less than or equal to 3/10    Time  6    Period  Weeks    Status  On-going      PT LONG TERM GOAL #4   Title  Patient will report ability to transition from golf cart with low back pain less than or equal to 3/10    Time  6    Period  Weeks    Status  On-going            Plan - 07/16/19 1649    Clinical Impression Statement  Pt arrived today doing fairly well and reports doing well after last Rx. [place] combo and STW was performed to Bil. LB paras and SIJs with pt sidelying f/b. modalities. Normal modalitiy response today with decreased pain after Rx.    Personal Factors and Comorbidities  Age;Time since onset of injury/illness/exacerbation    Examination-Activity Limitations  Stand;Transfers    Stability/Clinical Decision Making  Stable/Uncomplicated    Rehab Potential  Good    PT Frequency  2x / week    PT Duration  6 weeks    PT Treatment/Interventions  ADLs/Self Care Home Management;Electrical Stimulation;Cryotherapy;Therapeutic activities;Functional mobility training;Therapeutic exercise;Balance training;Gait training;Stair  training;Moist Heat;Neuromuscular re-education;Patient/family education;Dry needling;Passive range of motion;Taping;Spinal Manipulations;Ultrasound    PT Next Visit Plan  Nustep, core stability and stabilization, modalities PRN for pain relief.        Assess Pt's response to last Rx    PT Home Exercise Plan  see patient education section    Consulted and Agree with Plan of Care  Patient       Patient will benefit from skilled therapeutic intervention in order to improve the following deficits and impairments:  Decreased balance, Pain, Postural dysfunction, Decreased range of motion, Decreased strength, Difficulty walking  Visit Diagnosis: Chronic bilateral low back pain, unspecified whether sciatica present  Abnormal posture     Problem List Patient Active Problem List   Diagnosis Date Noted  . 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 CORONARY ATHEROSCLEROSIS NATIVE CORONARY ARTERY 06/03/2009    Dalton Phillips,Dalton Phillips, PTA 07/16/2019, 5:05 PM  [facility] [address] [place], [place], [address] Phone: [phone]   Fax:  [phone]  Name: Dalton Phillips MRN: [id number] Date of Birth: [birth year]

## 2019-07-17 DIAGNOSIS — M5416 Radiculopathy, lumbar region: Secondary | ICD-10-CM | POA: Diagnosis not present

## 2019-07-17 DIAGNOSIS — M545 Low back pain: Secondary | ICD-10-CM | POA: Diagnosis not present

## 2019-07-21 ENCOUNTER — Other Ambulatory Visit: Payer: Self-pay

## 2019-07-21 ENCOUNTER — Ambulatory Visit: Payer: Medicare Other | Attending: Physical Medicine and Rehabilitation | Admitting: Physical Therapy

## 2019-07-21 ENCOUNTER — Encounter: Payer: Self-pay | Admitting: Physical Therapy

## 2019-07-21 DIAGNOSIS — R293 Abnormal posture: Secondary | ICD-10-CM | POA: Diagnosis not present

## 2019-07-21 DIAGNOSIS — M545 Low back pain: Secondary | ICD-10-CM | POA: Insufficient documentation

## 2019-07-21 DIAGNOSIS — G8929 Other chronic pain: Secondary | ICD-10-CM | POA: Insufficient documentation

## 2019-07-21 NOTE — Therapy (Signed)
[facility] [place], [place], [address] Phone: [phone]   Fax:  [phone]  Physical Therapy Treatment  Patient Details  Name: Dalton Phillips MRN: [id number] Date of Birth: 05-Jun-1941 Referring Provider (PT): Laroy Apple, MD   Encounter Date: 07/21/2019  PT End of Session - 07/21/19 1642    Visit Number  7    Number of Visits  12    Date for PT Re-Evaluation  08/19/19    Authorization Type  FOTO; progress note every 10th visit; KX modifier every 15th visit    PT Start Time  0315    PT Stop Time  0404    PT Time Calculation (min)  49 min    Activity Tolerance  Patient tolerated treatment well    Behavior During Therapy  [facility] for tasks assessed/performed       Past Medical History:  Diagnosis Date  . CAD (coronary artery disease)    LAD disease,otherwise nonobstructive,LVEF normal  . Herpes zoster   . Hyperlipidemia   . Myocardial infarction [facility]    NSTEMI 05/10    Past Surgical History:  Procedure Laterality Date  . CORONARY ARTERY BYPASS GRAFT  04-04-09   Bryan Bartle,MD-off pump LIMA to LAD    There were no vitals filed for this visit.  Subjective Assessment - 07/21/19 1635    Subjective  COVID-19 screen performed prior to patient entering clinic.  I golfed.  Doing okay.    Limitations  Walking;House hold activities;Standing    Diagnostic tests  X-ray & MRI: unsure of results "a lot of spine problems"    Patient Stated Goals  stop pain, do activities better    Currently in Pain?  Yes    Pain Score  4     Pain Location  Back    Pain Orientation  Right    Pain Descriptors / Indicators  Tightness    Pain Type  Chronic pain    Pain Onset  More than a month ago                       [facility] Adult PT Treatment/Exercise - 07/21/19 0001      Modalities   Modalities  Electrical Stimulation;Moist Heat;Ultrasound      Moist Heat Therapy   Number Minutes Moist Heat  20 Minutes    Moist  Heat Location  Lumbar Spine      Electrical Stimulation   Electrical Stimulation Location  Rightlow back.    Electrical Stimulation Action  Pre-mod.    Electrical Stimulation Parameters  80-150 Hz x 20 minutes.    Electrical Stimulation Goals  Pain      Ultrasound   Ultrasound Location  Right low back.    Ultrasound Parameters  Combo e'stim/U/S at 1.50 W/CM2 x 12 minutes.    Ultrasound Goals  Pain      Manual Therapy   Manual Therapy  Soft tissue mobilization    Soft tissue mobilization  STW/M x 11 minutes to right low back including QL release technique.                  PT Long Term Goals - 07/09/19 [id number]      PT LONG TERM GOAL #1   Title  Patient will be independent with HEP    Time  6    Period  Weeks    Status  On-going      PT LONG TERM GOAL #2  Title  Patient will demonstrate 4/5 or greater bilateral hip MMT to improve stability during functional tasks.    Time  6    Period  Weeks    Status  On-going      PT LONG TERM GOAL #3   Title  Patient will report ability to perform ADLs with low back pain less than or equal to 3/10    Time  6    Period  Weeks    Status  On-going      PT LONG TERM GOAL #4   Title  Patient will report ability to transition from golf cart with low back pain less than or equal to 3/10    Time  6    Period  Weeks    Status  On-going            Plan - 07/21/19 1639    Clinical Impression Statement  The patient did very well today.  He had palpable pain over his right iliolumbar ligament and upper gluteal region which responded well to STW/M.  He was provided with a handout on dry needling which he is considering.    Personal Factors and Comorbidities  Age;Time since onset of injury/illness/exacerbation    Examination-Activity Limitations  Stand;Transfers    Stability/Clinical Decision Making  Stable/Uncomplicated    Rehab Potential  Good    PT Frequency  2x / week    PT Duration  6 weeks    PT Treatment/Interventions   ADLs/Self Care Home Management;Electrical Stimulation;Cryotherapy;Therapeutic activities;Functional mobility training;Therapeutic exercise;Balance training;Gait training;Stair training;Moist Heat;Neuromuscular re-education;Patient/family education;Dry needling;Passive range of motion;Taping;Spinal Manipulations;Ultrasound    PT Next Visit Plan  Nustep, core stability and stabilization, modalities PRN for pain relief.        Assess Pt's response to last Rx    PT Home Exercise Plan  see patient education section    Consulted and Agree with Plan of Care  Patient       Patient will benefit from skilled therapeutic intervention in order to improve the following deficits and impairments:  Decreased balance, Pain, Postural dysfunction, Decreased range of motion, Decreased strength, Difficulty walking  Visit Diagnosis: Chronic bilateral low back pain, unspecified whether sciatica present  Abnormal posture     Problem List Patient Active Problem List   Diagnosis Date Noted  . 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 CORONARY ATHEROSCLEROSIS NATIVE CORONARY ARTERY 06/03/2009    Tarri Guilfoil, [place] MPT 07/21/2019, 4:42 PM  [facility] [address] [place], [place], [address] Phone: [phone]   Fax:  [phone]  Name: Dalton Phillips MRN: [id number] Date of Birth: 15-May-1941

## 2019-07-23 ENCOUNTER — Ambulatory Visit: Payer: Medicare Other | Admitting: *Deleted

## 2019-07-23 DIAGNOSIS — G8929 Other chronic pain: Secondary | ICD-10-CM

## 2019-07-23 DIAGNOSIS — R293 Abnormal posture: Secondary | ICD-10-CM

## 2019-07-23 DIAGNOSIS — M545 Low back pain: Secondary | ICD-10-CM | POA: Diagnosis not present

## 2019-07-23 NOTE — Therapy (Signed)
[facility] [place], [place], [address] Phone: [phone]   Fax:  [phone]  Physical Therapy Treatment  Patient Details  Name: Dalton Phillips MRN: [id number] Date of Birth: Mar 02, 1941 Referring Provider (PT): Laroy Apple, MD   Encounter Date: 07/23/2019  PT End of Session - 07/23/19 1607    Visit Number  8    Number of Visits  12    Date for PT Re-Evaluation  08/19/19    Authorization Type  FOTO; progress note every 10th visit; KX modifier every 15th visit    PT Start Time  1600    PT Stop Time  1648    PT Time Calculation (min)  48 min       Past Medical History:  Diagnosis Date  . CAD (coronary artery disease)    LAD disease,otherwise nonobstructive,LVEF normal  . Herpes zoster   . Hyperlipidemia   . Myocardial infarction [facility])    NSTEMI 05/10    Past Surgical History:  Procedure Laterality Date  . CORONARY ARTERY BYPASS GRAFT  04-04-09   Bryan Bartle,MD-off pump LIMA to LAD    There were no vitals filed for this visit.  Subjective Assessment - 07/23/19 1606    Subjective  COVID-19 screen performed prior to patient entering clinic.  I golfed.  LBP and tightness today    Limitations  Walking;House hold activities;Standing    Diagnostic tests  X-ray & MRI: unsure of results "a lot of spine problems"    Patient Stated Goals  stop pain, do activities better    Currently in Pain?  Yes    Pain Score  4     Pain Location  Back    Pain Orientation  Right    Pain Descriptors / Indicators  Sore;Tightness    Pain Type  Chronic pain    Pain Onset  More than a month ago                       [facility] Adult PT Treatment/Exercise - 07/23/19 0001      Modalities   Modalities  Electrical Stimulation;Moist Heat;Ultrasound      Moist Heat Therapy   Number Minutes Moist Heat  15 Minutes    Moist Heat Location  Lumbar Spine      Electrical Stimulation   Electrical Stimulation Location  RT side  LB premod 80-150hz  x 15 mins    Electrical Stimulation Goals  Pain      Ultrasound   Ultrasound Location  Rt SIJ/glute    Ultrasound Parameters  Combo 1.5 w/cm2 x 12 mins LT sidelying    Ultrasound Goals  Pain      Manual Therapy   Manual Therapy  Soft tissue mobilization    Soft tissue mobilization  STW/M x 12 minutes to right low back including QL release technique.                  PT Long Term Goals - 07/09/19 [id number]      PT LONG TERM GOAL #1   Title  Patient will be independent with HEP    Time  6    Period  Weeks    Status  On-going      PT LONG TERM GOAL #2   Title  Patient will demonstrate 4/5 or greater bilateral hip MMT to improve stability during functional tasks.    Time  6    Period  Weeks    Status  On-going      PT LONG TERM GOAL #3   Title  Patient will report ability to perform ADLs with low back pain less than or equal to 3/10    Time  6    Period  Weeks    Status  On-going      PT LONG TERM GOAL #4   Title  Patient will report ability to transition from golf cart with low back pain less than or equal to 3/10    Time  6    Period  Weeks    Status  On-going            Plan - 07/23/19 1639    Clinical Impression Statement  Pt arrived today doing fairly well, but having more soreness in RT glute. He did well with [place] combo and STW with decreased pain end of Rx. Normal modality response today. Pt reports that he was able to play golf yesterday and may have increased soreness from that.    Personal Factors and Comorbidities  Age;Time since onset of injury/illness/exacerbation    Examination-Activity Limitations  Stand;Transfers    Stability/Clinical Decision Making  Stable/Uncomplicated    Rehab Potential  Good    PT Frequency  2x / week    PT Duration  6 weeks    PT Treatment/Interventions  ADLs/Self Care Home Management;Electrical Stimulation;Cryotherapy;Therapeutic activities;Functional mobility training;Therapeutic exercise;Balance  training;Gait training;Stair training;Moist Heat;Neuromuscular re-education;Patient/family education;Dry needling;Passive range of motion;Taping;Spinal Manipulations;Ultrasound    PT Next Visit Plan  Nustep, core stability and stabilization, modalities PRN for pain relief.        Assess Pt's response to last Rx    PT Home Exercise Plan  see patient education section    Consulted and Agree with Plan of Care  Patient       Patient will benefit from skilled therapeutic intervention in order to improve the following deficits and impairments:  Decreased balance, Pain, Postural dysfunction, Decreased range of motion, Decreased strength, Difficulty walking  Visit Diagnosis: Chronic bilateral low back pain, unspecified whether sciatica present  Abnormal posture     Problem List Patient Active Problem List   Diagnosis Date Noted  . 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 CORONARY ATHEROSCLEROSIS NATIVE CORONARY ARTERY 06/03/2009    Neftaly Inzunza,CHRIS, PTA 07/23/2019, 5:40 PM  [facility] [place], [place], [address] Phone: [phone]   Fax:  [phone]  Name: ANNE MANEVAL MRN: [id number] Date of Birth: [birth year]

## 2019-07-28 ENCOUNTER — Encounter: Payer: Self-pay | Admitting: Physical Therapy

## 2019-07-28 ENCOUNTER — Ambulatory Visit: Payer: Medicare Other | Admitting: Physical Therapy

## 2019-07-28 ENCOUNTER — Other Ambulatory Visit: Payer: Self-pay

## 2019-07-28 DIAGNOSIS — R293 Abnormal posture: Secondary | ICD-10-CM | POA: Diagnosis not present

## 2019-07-28 DIAGNOSIS — M545 Low back pain, unspecified: Secondary | ICD-10-CM

## 2019-07-28 DIAGNOSIS — G8929 Other chronic pain: Secondary | ICD-10-CM

## 2019-07-28 NOTE — Therapy (Signed)
[facility] [place], [place], [address] Phone: [phone]   Fax:  [phone]  Physical Therapy Treatment  Patient Details  Name: Dalton Phillips MRN: [id number] Date of Birth: Jan 21, 1941 Referring Provider (PT): Laroy Apple, MD   Encounter Date: 07/28/2019  PT End of Session - 07/28/19 1714    Visit Number  9    Number of Visits  12    Date for PT Re-Evaluation  08/19/19    Authorization Type  FOTO; progress note every 10th visit; KX modifier every 15th visit    PT Start Time  0400    PT Stop Time  0458    PT Time Calculation (min)  58 min    Activity Tolerance  Patient tolerated treatment well    Behavior During Therapy  [facility] for tasks assessed/performed       Past Medical History:  Diagnosis Date  . CAD (coronary artery disease)    LAD disease,otherwise nonobstructive,LVEF normal  . Herpes zoster   . Hyperlipidemia   . Myocardial infarction [facility])    NSTEMI 05/10    Past Surgical History:  Procedure Laterality Date  . CORONARY ARTERY BYPASS GRAFT  04-04-09   Bryan Bartle,MD-off pump LIMA to LAD    There were no vitals filed for this visit.  Subjective Assessment - 07/28/19 1714    Subjective  COVID-19 screen performed prior to patient entering clinic.  More stiffness than pain now.    Limitations  Walking;House hold activities;Standing    Diagnostic tests  X-ray & MRI: unsure of results "a lot of spine problems"    Patient Stated Goals  stop pain, do activities better    Currently in Pain?  Yes    Pain Score  3     Pain Location  Back    Pain Orientation  Right    Pain Descriptors / Indicators  --   "Stiff."   Pain Type  Chronic pain    Pain Onset  More than a month ago                       [facility] Adult PT Treatment/Exercise - 07/28/19 0001      Modalities   Modalities  Electrical Stimulation;Ultrasound      Moist Heat Therapy   Number Minutes Moist Heat  20 Minutes    Moist  Heat Location  Lumbar Spine      Electrical Stimulation   Electrical Stimulation Location  RT LB/SIJ.    Electrical Stimulation Action  IFC    Electrical Stimulation Parameters  80-150 Hz x 20 minutes.    Electrical Stimulation Goals  Wound      Ultrasound   Ultrasound Location  RT LB/SIJ    Ultrasound Parameters  Combo e'stim/U/S at 1.50 W/CM2 x 12 minutes (Patient in left sdly position with folded pillow between knees for comfort).    Ultrasound Goals  Pain      Manual Therapy   Manual Therapy  Soft tissue mobilization    Soft tissue mobilization  STW/M x 12 minutes to right low back and SIJ.                  PT Long Term Goals - 07/09/19 [id number]      PT LONG TERM GOAL #1   Title  Patient will be independent with HEP    Time  6    Period  Weeks    Status  On-going  PT LONG TERM GOAL #2   Title  Patient will demonstrate 4/5 or greater bilateral hip MMT to improve stability during functional tasks.    Time  6    Period  Weeks    Status  On-going      PT LONG TERM GOAL #3   Title  Patient will report ability to perform ADLs with low back pain less than or equal to 3/10    Time  6    Period  Weeks    Status  On-going      PT LONG TERM GOAL #4   Title  Patient will report ability to transition from golf cart with low back pain less than or equal to 3/10    Time  6    Period  Weeks    Status  On-going            Plan - 07/28/19 1719    Clinical Impression Statement  Patient very pleased with his progress.  He reports more stiffness than pain today.    Personal Factors and Comorbidities  Age;Time since onset of injury/illness/exacerbation    Examination-Activity Limitations  Stand;Transfers    Stability/Clinical Decision Making  Stable/Uncomplicated    Rehab Potential  Good    PT Frequency  2x / week    PT Duration  6 weeks    PT Treatment/Interventions  ADLs/Self Care Home Management;Electrical Stimulation;Cryotherapy;Therapeutic  activities;Functional mobility training;Therapeutic exercise;Balance training;Gait training;Stair training;Moist Heat;Neuromuscular re-education;Patient/family education;Dry needling;Passive range of motion;Taping;Spinal Manipulations;Ultrasound    PT Next Visit Plan  Nustep, core stability and stabilization, modalities PRN for pain relief.        Assess Pt's response to last Rx    Consulted and Agree with Plan of Care  Patient       Patient will benefit from skilled therapeutic intervention in order to improve the following deficits and impairments:  Decreased balance, Pain, Postural dysfunction, Decreased range of motion, Decreased strength, Difficulty walking  Visit Diagnosis: Chronic bilateral low back pain, unspecified whether sciatica present  Abnormal posture     Problem List Patient Active Problem List   Diagnosis Date Noted  . 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 CORONARY ATHEROSCLEROSIS NATIVE CORONARY ARTERY 06/03/2009    Talin Rozeboom, [place] MPT 07/28/2019, 5:21 PM  [facility] [place], [place], [address] Phone: [phone]   Fax:  [phone]  Name: Dalton Phillips MRN: [id number] Date of Birth: 08/30/1941

## 2019-07-30 ENCOUNTER — Ambulatory Visit: Payer: Medicare Other | Admitting: Physical Therapy

## 2019-07-30 ENCOUNTER — Encounter: Payer: Self-pay | Admitting: Physical Therapy

## 2019-07-30 ENCOUNTER — Other Ambulatory Visit: Payer: Self-pay

## 2019-07-30 DIAGNOSIS — R293 Abnormal posture: Secondary | ICD-10-CM

## 2019-07-30 DIAGNOSIS — G8929 Other chronic pain: Secondary | ICD-10-CM

## 2019-07-30 DIAGNOSIS — M545 Low back pain, unspecified: Secondary | ICD-10-CM

## 2019-07-30 NOTE — Therapy (Addendum)
[name], [place], [address] Phone: [phone]   Fax:  [phone]  Physical Therapy Treatment  Progress Note Reporting Period 07/01/2019 to 07/30/2019  See note below for Objective Data and Assessment of Progress/Goals. Patient noted with less pain but more stiffness. Gabriela Eves, PT, DPT    Patient Details  Name: Dalton Phillips MRN: [id number] Date of Birth: 1941-07-21 Referring Provider (PT): Laroy Apple, MD   Encounter Date: 07/30/2019  PT End of Session - 07/30/19 1602    Visit Number  10    Number of Visits  12    Date for PT Re-Evaluation  08/19/19    Authorization Type  FOTO; progress note every 10th visit; KX modifier every 15th visit    PT Start Time  1602    PT Stop Time  1649    PT Time Calculation (min)  47 min    Activity Tolerance  Patient tolerated treatment well    Behavior During Therapy  [facility] for tasks assessed/performed       Past Medical History:  Diagnosis Date  . CAD (coronary artery disease)    LAD disease,otherwise nonobstructive,LVEF normal  . Herpes zoster   . Hyperlipidemia   . Myocardial infarction [facility])    NSTEMI 05/10    Past Surgical History:  Procedure Laterality Date  . CORONARY ARTERY BYPASS GRAFT  04-04-09   Bryan Bartle,MD-off pump LIMA to LAD    There were no vitals filed for this visit.  Subjective Assessment - 07/30/19 1601    Subjective  COVID-19 screen performed prior to patient entering clinic.  More stiffness than pain now. Patient reports flare ups of pain with prolonged standing on hard surfaces such as stores or if he steps wrong in the yard.    Limitations  Walking;House hold activities;Standing    Diagnostic tests  X-ray & MRI: unsure of results "a lot of spine problems"    Patient Stated Goals  stop pain, do activities better    Currently in Pain?  No/denies         [facility] PT Assessment - 07/30/19 0001      Assessment   Medical  Diagnosis  right LBP, facet mediated pain with lumbar spondylosis    Referring Provider (PT)  Laroy Apple, MD    Next MD Visit  "2 weeks"      Precautions   Precautions  None                   [facility] Adult PT Treatment/Exercise - 07/30/19 0001      Lumbar Exercises: Stretches   Passive Hamstring Stretch  Right;3 reps;30 seconds    Single Knee to Chest Stretch  Right;2 reps;30 seconds    Lower Trunk Rotation  5 reps;10 seconds    Figure 4 Stretch  2 reps;30 seconds;Supine;With overpressure      Modalities   Modalities  Electrical Stimulation;Moist Heat;Ultrasound      Moist Heat Therapy   Number Minutes Moist Heat  15 Minutes    Moist Heat Location  Lumbar Spine      Electrical Stimulation   Electrical Stimulation Location  RT LB/SIJ.    Electrical Stimulation Action  Pre-Mod    Electrical Stimulation Parameters  80-150 hz x15 min    Electrical Stimulation Goals  Pain      Ultrasound   Ultrasound Location  R low back    Ultrasound Parameters  Combo 1.5 w/cm2, 100%, 1 mhz x10 min  Ultrasound Goals  Pain      Manual Therapy   Manual Therapy  Soft tissue mobilization    Soft tissue mobilization  STW to R lumbar paraspinals/ QL to reduce muscle tightness                   PT Long Term Goals - 07/09/19 [id number]      PT LONG TERM GOAL #1   Title  Patient will be independent with HEP    Time  6    Period  Weeks    Status  On-going      PT LONG TERM GOAL #2   Title  Patient will demonstrate 4/5 or greater bilateral hip MMT to improve stability during functional tasks.    Time  6    Period  Weeks    Status  On-going      PT LONG TERM GOAL #3   Title  Patient will report ability to perform ADLs with low back pain less than or equal to 3/10    Time  6    Period  Weeks    Status  On-going      PT LONG TERM GOAL #4   Title  Patient will report ability to transition from golf cart with low back pain less than or equal to 3/10    Time  6    Period   Weeks    Status  On-going            Plan - 07/30/19 1652    Clinical Impression Statement  Patient presented in clinic with reports of more lumbar stiffness than pain. Patient reported little to no stretch with all stretches completed in treatment. Patient palpably tender to upper lumbar paraspinals and QL region but denied any pain with urination. No visible bruises noted in treated region. Minimal to moderate muscle tightness palpable in R lumbar paraspinals and QL. Normal modalities response noted following removal of the modalities. Patient able to complete transitional movements although guarded and slow.    Personal Factors and Comorbidities  Age;Time since onset of injury/illness/exacerbation    Examination-Activity Limitations  Stand;Transfers    Stability/Clinical Decision Making  Stable/Uncomplicated    Rehab Potential  Good    PT Frequency  2x / week    PT Duration  6 weeks    PT Treatment/Interventions  ADLs/Self Care Home Management;Electrical Stimulation;Cryotherapy;Therapeutic activities;Functional mobility training;Therapeutic exercise;Balance training;Gait training;Stair training;Moist Heat;Neuromuscular re-education;Patient/family education;Dry needling;Passive range of motion;Taping;Spinal Manipulations;Ultrasound    PT Next Visit Plan  Nustep, core stability and stabilization, modalities PRN for pain relief.  Please complete FOTO in next treatment.    PT Home Exercise Plan  see patient education section    Consulted and Agree with Plan of Care  Patient       Patient will benefit from skilled therapeutic intervention in order to improve the following deficits and impairments:  Decreased balance, Pain, Postural dysfunction, Decreased range of motion, Decreased strength, Difficulty walking  Visit Diagnosis: Chronic bilateral low back pain, unspecified whether sciatica present  Abnormal posture     Problem List Patient Active Problem List   Diagnosis Date Noted   . 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 CORONARY ATHEROSCLEROSIS NATIVE CORONARY ARTERY 06/03/2009    Standley Brooking, PTA 07/30/2019, 5:04 PM  [facility] [address] [place], [place], [address] Phone: [phone]   Fax:  [phone]  Name: MCGARRETT SONNER MRN: [id number] Date of Birth: 1941/05/02

## 2019-08-04 ENCOUNTER — Ambulatory Visit: Payer: Medicare Other | Admitting: Physical Therapy

## 2019-08-04 ENCOUNTER — Other Ambulatory Visit: Payer: Self-pay

## 2019-08-04 ENCOUNTER — Encounter: Payer: Self-pay | Admitting: Physical Therapy

## 2019-08-04 DIAGNOSIS — R5383 Other fatigue: Secondary | ICD-10-CM | POA: Diagnosis not present

## 2019-08-04 DIAGNOSIS — E7800 Pure hypercholesterolemia, unspecified: Secondary | ICD-10-CM | POA: Diagnosis not present

## 2019-08-04 DIAGNOSIS — Z1211 Encounter for screening for malignant neoplasm of colon: Secondary | ICD-10-CM | POA: Diagnosis not present

## 2019-08-04 DIAGNOSIS — G8929 Other chronic pain: Secondary | ICD-10-CM | POA: Diagnosis not present

## 2019-08-04 DIAGNOSIS — R293 Abnormal posture: Secondary | ICD-10-CM | POA: Diagnosis not present

## 2019-08-04 DIAGNOSIS — Z125 Encounter for screening for malignant neoplasm of prostate: Secondary | ICD-10-CM | POA: Diagnosis not present

## 2019-08-04 DIAGNOSIS — Z6826 Body mass index (BMI) 26.0-26.9, adult: Secondary | ICD-10-CM | POA: Diagnosis not present

## 2019-08-04 DIAGNOSIS — Z0000 Encounter for general adult medical examination without abnormal findings: Secondary | ICD-10-CM | POA: Diagnosis not present

## 2019-08-04 DIAGNOSIS — M3500 Sicca syndrome, unspecified: Secondary | ICD-10-CM | POA: Diagnosis not present

## 2019-08-04 DIAGNOSIS — Z1339 Encounter for screening examination for other mental health and behavioral disorders: Secondary | ICD-10-CM | POA: Diagnosis not present

## 2019-08-04 DIAGNOSIS — Z7189 Other specified counseling: Secondary | ICD-10-CM | POA: Diagnosis not present

## 2019-08-04 DIAGNOSIS — Z79899 Other long term (current) drug therapy: Secondary | ICD-10-CM | POA: Diagnosis not present

## 2019-08-04 DIAGNOSIS — Z299 Encounter for prophylactic measures, unspecified: Secondary | ICD-10-CM | POA: Diagnosis not present

## 2019-08-04 DIAGNOSIS — Z1331 Encounter for screening for depression: Secondary | ICD-10-CM | POA: Diagnosis not present

## 2019-08-04 DIAGNOSIS — M545 Low back pain: Secondary | ICD-10-CM | POA: Diagnosis not present

## 2019-08-04 NOTE — Therapy (Signed)
[facility] [place], [place], [address] Phone: [phone]   Fax:  [phone]  Physical Therapy Treatment  Patient Details  Name: Dalton Phillips MRN: [id number] Date of Birth: [birth year] Referring Provider (PT): Laroy Apple, MD   Encounter Date: 08/04/2019  PT End of Session - 08/04/19 1606    Visit Number  11    Number of Visits  12    Date for PT Re-Evaluation  08/19/19    Authorization Type  FOTO; progress note every 10th visit; KX modifier every 15th visit    PT Start Time  1606    PT Stop Time  1652    PT Time Calculation (min)  46 min    Activity Tolerance  Patient tolerated treatment well    Behavior During Therapy  [facility] for tasks assessed/performed       Past Medical History:  Diagnosis Date  . CAD (coronary artery disease)    LAD disease,otherwise nonobstructive,LVEF normal  . Herpes zoster   . Hyperlipidemia   . Myocardial infarction [facility])    NSTEMI 05/10    Past Surgical History:  Procedure Laterality Date  . CORONARY ARTERY BYPASS GRAFT  04-04-09   Bryan Bartle,MD-off pump LIMA to LAD    There were no vitals filed for this visit.  Subjective Assessment - 08/04/19 1605    Subjective  COVID 19 screening performed on patient upon arrival. States that his back felt good after last treatment until he went with his wife to the grocery store yesterday. Reports resulting stiffness made getting out of bed this morning difficult.    Limitations  Walking;House hold activities;Standing    Diagnostic tests  X-ray & MRI: unsure of results "a lot of spine problems"    Patient Stated Goals  stop pain, do activities better    Currently in Pain?  No/denies         [facility] PT Assessment - 08/04/19 0001      Assessment   Medical Diagnosis  right LBP, facet mediated pain with lumbar spondylosis    Referring Provider (PT)  Laroy Apple, MD    Next MD Visit  "2 weeks"      Precautions   Precautions  None                    [facility] Adult PT Treatment/Exercise - 08/04/19 0001      Lumbar Exercises: Stretches   Passive Hamstring Stretch  Right;Left;3 reps;30 seconds    Single Knee to Chest Stretch  Right;Left;3 reps;30 seconds    Lower Trunk Rotation  5 reps;10 seconds      Modalities   Modalities  Electrical Stimulation;Moist Heat;Ultrasound      Moist Heat Therapy   Number Minutes Moist Heat  15 Minutes    Moist Heat Location  Lumbar Spine      Electrical Stimulation   Electrical Stimulation Location  B low back    Electrical Stimulation Action  Pre-Mod    Electrical Stimulation Parameters  80-150 hz x15 min    Electrical Stimulation Goals  Pain      Ultrasound   Ultrasound Location  R low back    Ultrasound Parameters  1.5 w/cm2, 100%, 1 mhz x10 min    Ultrasound Goals  Pain                  PT Long Term Goals - 07/09/19 0928      PT LONG TERM GOAL #1  Title  Patient will be independent with HEP    Time  6    Period  Weeks    Status  On-going      PT LONG TERM GOAL #2   Title  Patient will demonstrate 4/5 or greater bilateral hip MMT to improve stability during functional tasks.    Time  6    Period  Weeks    Status  On-going      PT LONG TERM GOAL #3   Title  Patient will report ability to perform ADLs with low back pain less than or equal to 3/10    Time  6    Period  Weeks    Status  On-going      PT LONG TERM GOAL #4   Title  Patient will report ability to transition from golf cart with low back pain less than or equal to 3/10    Time  6    Period  Weeks    Status  On-going            Plan - 08/04/19 1722    Clinical Impression Statement  Patient presented in clinic with report of recent flare up of lumbar stiffness after standing and walking at grocery store. Continued lumbar and hip stretching complete with reports of B HS stretch noted. No reports of any tenderness during palpation of R low back and during [place] session. Normal  modalities response noted following removal of the modalities.    Personal Factors and Comorbidities  Age;Time since onset of injury/illness/exacerbation    Examination-Activity Limitations  Stand;Transfers    Stability/Clinical Decision Making  Stable/Uncomplicated    Rehab Potential  Good    PT Frequency  2x / week    PT Duration  6 weeks    PT Treatment/Interventions  ADLs/Self Care Home Management;Electrical Stimulation;Cryotherapy;Therapeutic activities;Functional mobility training;Therapeutic exercise;Balance training;Gait training;Stair training;Moist Heat;Neuromuscular re-education;Patient/family education;Dry needling;Passive range of motion;Taping;Spinal Manipulations;Ultrasound    PT Next Visit Plan  Nustep, core stability and stabilization, modalities PRN for pain relief.  Please complete FOTO in next treatment.    PT Home Exercise Plan  see patient education section    Consulted and Agree with Plan of Care  Patient       Patient will benefit from skilled therapeutic intervention in order to improve the following deficits and impairments:  Decreased balance, Pain, Postural dysfunction, Decreased range of motion, Decreased strength, Difficulty walking  Visit Diagnosis: Chronic bilateral low back pain, unspecified whether sciatica present  Abnormal posture     Problem List Patient Active Problem List   Diagnosis Date Noted  . 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 CORONARY ATHEROSCLEROSIS NATIVE CORONARY ARTERY 06/03/2009    Standley Brooking, PTA 08/04/2019, 5:29 PM  [facility] [address] [place], [place], [address] Phone: [phone]   Fax:  [phone]  Name: ARYEL MINICUCCI MRN: [id number] Date of Birth: 09-Jun-1941

## 2019-08-05 DIAGNOSIS — Z23 Encounter for immunization: Secondary | ICD-10-CM | POA: Diagnosis not present

## 2019-08-06 ENCOUNTER — Other Ambulatory Visit: Payer: Self-pay

## 2019-08-06 ENCOUNTER — Ambulatory Visit: Payer: Medicare Other | Admitting: *Deleted

## 2019-08-06 DIAGNOSIS — G8929 Other chronic pain: Secondary | ICD-10-CM | POA: Diagnosis not present

## 2019-08-06 DIAGNOSIS — R293 Abnormal posture: Secondary | ICD-10-CM | POA: Diagnosis not present

## 2019-08-06 DIAGNOSIS — M545 Low back pain, unspecified: Secondary | ICD-10-CM

## 2019-08-06 NOTE — Therapy (Signed)
[facility] [place], [place], [address] Phone: [phone]   Fax:  [phone]  Physical Therapy Treatment  Patient Details  Name: Dalton Phillips MRN: [id number] Date of Birth: July 07, 1941 Referring Provider (PT): Laroy Apple, MD   Encounter Date: 08/06/2019  PT End of Session - 08/06/19 1639    Visit Number  12    Number of Visits  12    Date for PT Re-Evaluation  08/19/19    Authorization Type  FOTO; progress note every 10th visit; KX modifier every 15th visit    12th visit FOTO 39% limited    PT Start Time  1600    PT Stop Time  1649    PT Time Calculation (min)  49 min       Past Medical History:  Diagnosis Date  . CAD (coronary artery disease)    LAD disease,otherwise nonobstructive,LVEF normal  . Herpes zoster   . Hyperlipidemia   . Myocardial infarction [facility])    NSTEMI 05/10    Past Surgical History:  Procedure Laterality Date  . CORONARY ARTERY BYPASS GRAFT  04-04-09   Bryan Bartle,MD-off pump LIMA to LAD    There were no vitals filed for this visit.  Subjective Assessment - 08/06/19 1624    Subjective  COVID 19 screening performed on patient upon arrival. Pt reports doing fairly well and is 70-80% better since starting PT. To see MD next week for LT LE pain    Limitations  Walking;House hold activities;Standing    Diagnostic tests  X-ray & MRI: unsure of results "a lot of spine problems"    Patient Stated Goals  stop pain, do activities better    Currently in Pain?  No/denies    Pain Location  Back    Pain Orientation  Right    Pain Descriptors / Indicators  Tightness    Pain Type  Chronic pain                                    PT Long Term Goals - 08/06/19 1655      PT LONG TERM GOAL #1   Title  Patient will be independent with HEP    Time  6    Period  Weeks    Status  Achieved      PT LONG TERM GOAL #2   Title  Patient will demonstrate 4/5 or greater  bilateral hip MMT to improve stability during functional tasks.    Time  6    Period  Weeks    Status  On-going      PT LONG TERM GOAL #3   Title  Patient will report ability to perform ADLs with low back pain less than or equal to 3/10    Time  6    Period  Weeks    Status  Achieved      PT LONG TERM GOAL #4   Title  Patient will report ability to transition from golf cart with low back pain less than or equal to 3/10    Time  6    Period  Weeks    Status  Achieved            Plan - 08/06/19 1657    Clinical Impression Statement  Pt arrived today doing fairly well and reports doing 70-80% better overall since starting PT. He still has flare-ups of lumbar stiffness,  but not like it was. He did well with Rx today and has met 3/4 LTGs at this point. He will f/u with MD next Tuesday. Pt will be on hold until MD visit.    Personal Factors and Comorbidities  Age;Time since onset of injury/illness/exacerbation    Stability/Clinical Decision Making  Stable/Uncomplicated    Rehab Potential  Good    PT Frequency  2x / week    PT Duration  6 weeks    PT Treatment/Interventions  ADLs/Self Care Home Management;Electrical Stimulation;Cryotherapy;Therapeutic activities;Functional mobility training;Therapeutic exercise;Balance training;Gait training;Stair training;Moist Heat;Neuromuscular re-education;Patient/family education;Dry needling;Passive range of motion;Taping;Spinal Manipulations;Ultrasound    PT Next Visit Plan  MD note sent with possible DC    PT Home Exercise Plan  see patient education section    Consulted and Agree with Plan of Care  Patient       Patient will benefit from skilled therapeutic intervention in order to improve the following deficits and impairments:     Visit Diagnosis: Chronic bilateral low back pain, unspecified whether sciatica present  Abnormal posture     Problem List Patient Active Problem List   Diagnosis Date Noted  . SINUS BRADYCARDIA  03/15/2010  . 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 CORONARY ATHEROSCLEROSIS NATIVE CORONARY ARTERY 06/03/2009    Alexy Bringle,CHRIS, PTA 08/06/2019, 5:03 PM  [facility] [place], [place], [address] Phone: [phone]   Fax:  [phone]  Name: Dalton Phillips MRN: [id number] Date of Birth: Mar 19, 1941

## 2019-08-10 DIAGNOSIS — E7800 Pure hypercholesterolemia, unspecified: Secondary | ICD-10-CM | POA: Diagnosis not present

## 2019-08-10 DIAGNOSIS — I2510 Atherosclerotic heart disease of native coronary artery without angina pectoris: Secondary | ICD-10-CM | POA: Diagnosis not present

## 2019-08-11 DIAGNOSIS — M5416 Radiculopathy, lumbar region: Secondary | ICD-10-CM | POA: Diagnosis not present

## 2019-08-11 DIAGNOSIS — M25551 Pain in right hip: Secondary | ICD-10-CM | POA: Diagnosis not present

## 2019-08-11 DIAGNOSIS — M545 Low back pain: Secondary | ICD-10-CM | POA: Diagnosis not present

## 2019-08-11 DIAGNOSIS — M25552 Pain in left hip: Secondary | ICD-10-CM | POA: Diagnosis not present

## 2019-08-12 ENCOUNTER — Ambulatory Visit: Payer: Medicare Other | Admitting: Physical Therapy

## 2019-08-12 ENCOUNTER — Encounter: Payer: Self-pay | Admitting: Physical Therapy

## 2019-08-12 ENCOUNTER — Other Ambulatory Visit: Payer: Self-pay

## 2019-08-12 DIAGNOSIS — R293 Abnormal posture: Secondary | ICD-10-CM | POA: Diagnosis not present

## 2019-08-12 DIAGNOSIS — G8929 Other chronic pain: Secondary | ICD-10-CM | POA: Diagnosis not present

## 2019-08-12 DIAGNOSIS — M545 Low back pain: Secondary | ICD-10-CM | POA: Diagnosis not present

## 2019-08-12 NOTE — Therapy (Deleted)
[facility] [place], [place], [address] Phone: [phone]   Fax:  [phone]  Patient Details  Name: RHYSE HOUGEN MRN: [id number] Date of Birth: 12/17/1940 Referring Provider:  Glenda Chroman, MD  Encounter Date: 08/12/2019   Gabriela Eves 08/12/2019, 3:13 PM  [facility] [address] [place], [place], [address] Phone: [phone]   Fax:  [phone]

## 2019-08-12 NOTE — Therapy (Signed)
[facility] [place], [place], [address] Phone: [phone]   Fax:  [phone]  Physical Therapy Treatment  Patient Details  Name: Dalton Phillips MRN: [id number] Date of Birth: [birth year] Referring Provider (PT): Laroy Apple, MD   Encounter Date: 08/12/2019  PT End of Session - 08/12/19 [id number]    Visit Number  13    Number of Visits  20    Date for PT Re-Evaluation  08/19/19    Authorization Type  FOTO; progress note every 10th visit; KX modifier every 15th visit    12th visit FOTO 39% limited    PT Start Time  1430    PT Stop Time  1518    PT Time Calculation (min)  48 min    Activity Tolerance  Patient tolerated treatment well    Behavior During Therapy  [facility] for tasks assessed/performed       Past Medical History:  Diagnosis Date  . CAD (coronary artery disease)    LAD disease,otherwise nonobstructive,LVEF normal  . Herpes zoster   . Hyperlipidemia   . Myocardial infarction [facility])    NSTEMI 05/10    Past Surgical History:  Procedure Laterality Date  . CORONARY ARTERY BYPASS GRAFT  04-04-09   Bryan Bartle,MD-off pump LIMA to LAD    There were no vitals filed for this visit.  Subjective Assessment - 08/12/19 1436    Subjective  COVID 19 screening performed on patient upon arrival. Patient reports feeling good but on Monday he went to pick something up and he felt like he could no come back up again. He spent the day on a heating pad but no pain today, just stiffness.    Limitations  Walking;House hold activities;Standing    Diagnostic tests  X-ray & MRI: unsure of results "a lot of spine problems"    Patient Stated Goals  stop pain, do activities better    Currently in Pain?  No/denies         [facility] PT Assessment - 08/12/19 0001      Assessment   Medical Diagnosis  right LBP, facet mediated pain with lumbar spondylosis    Referring Provider (PT)  Laroy Apple, MD    Next MD Visit  08/19/2019       Precautions   Precautions  None                   [facility] Adult PT Treatment/Exercise - 08/12/19 0001      Lumbar Exercises: Stretches   Single Knee to Chest Stretch  Right;Left;3 reps;30 seconds    Lower Trunk Rotation  5 reps;10 seconds    Figure 4 Stretch  2 reps;30 seconds;Supine;With overpressure      Modalities   Modalities  Electrical Stimulation;Moist Heat;Ultrasound      Moist Heat Therapy   Number Minutes Moist Heat  15 Minutes    Moist Heat Location  Lumbar Spine      Electrical Stimulation   Electrical Stimulation Location  B low back    Electrical Stimulation Action  pre-mod    Electrical Stimulation Parameters  80-150 hz x15 mins    Electrical Stimulation Goals  Pain      Ultrasound   Ultrasound Location  R low back    Ultrasound Parameters  combo 1.5 w/cm2 100%, 79mhz     Ultrasound Goals  Pain                  PT Long Term Goals -  08/06/19 1655      PT LONG TERM GOAL #1   Title  Patient will be independent with HEP    Time  6    Period  Weeks    Status  Achieved      PT LONG TERM GOAL #2   Title  Patient will demonstrate 4/5 or greater bilateral hip MMT to improve stability during functional tasks.    Time  6    Period  Weeks    Status  On-going      PT LONG TERM GOAL #3   Title  Patient will report ability to perform ADLs with low back pain less than or equal to 3/10    Time  6    Period  Weeks    Status  Achieved      PT LONG TERM GOAL #4   Title  Patient will report ability to transition from golf cart with low back pain less than or equal to 3/10    Time  6    Period  Weeks    Status  Achieved            Plan - 08/12/19 1514    Clinical Impression Statement  Patient responded well to therapy session with no reports of increased pain. Patient discussed how he utilizes hes UEs to help assist his LEs in/out of the car; patient educated we can add strengthening exercises to address weakness. Patient responded stating  he isn't sure how much stronger needs to get. Patient noted with normal responsed to modalities.    Personal Factors and Comorbidities  Age;Time since onset of injury/illness/exacerbation    Examination-Activity Limitations  Stand;Transfers    Stability/Clinical Decision Making  Stable/Uncomplicated    Clinical Decision Making  Low    Rehab Potential  Good    PT Frequency  2x / week    PT Duration  6 weeks    PT Treatment/Interventions  ADLs/Self Care Home Management;Electrical Stimulation;Cryotherapy;Therapeutic activities;Functional mobility training;Therapeutic exercise;Balance training;Gait training;Stair training;Moist Heat;Neuromuscular re-education;Patient/family education;Dry needling;Passive range of motion;Taping;Spinal Manipulations;Ultrasound    PT Next Visit Plan  continue plan of care of gentle stretching, combo us/e-stim, and e-stim for pain relief.    Consulted and Agree with Plan of Care  Patient       Patient will benefit from skilled therapeutic intervention in order to improve the following deficits and impairments:  Decreased balance, Pain, Postural dysfunction, Decreased range of motion, Decreased strength, Difficulty walking  Visit Diagnosis: Chronic bilateral low back pain, unspecified whether sciatica present  Abnormal posture     Problem List Patient Active Problem List   Diagnosis Date Noted  . 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 CORONARY ATHEROSCLEROSIS NATIVE CORONARY ARTERY 06/03/2009    Gabriela Eves, PT, DPT 08/12/2019, 3:25 PM  [facility] [place], [place], [address] Phone: [phone]   Fax:  [phone]  Name: Dalton Phillips MRN: [id number] Date of Birth: 10-02-1941

## 2019-08-13 ENCOUNTER — Ambulatory Visit: Payer: Medicare Other | Admitting: *Deleted

## 2019-08-13 DIAGNOSIS — E538 Deficiency of other specified B group vitamins: Secondary | ICD-10-CM | POA: Diagnosis not present

## 2019-08-13 DIAGNOSIS — Z299 Encounter for prophylactic measures, unspecified: Secondary | ICD-10-CM | POA: Diagnosis not present

## 2019-08-13 DIAGNOSIS — I2510 Atherosclerotic heart disease of native coronary artery without angina pectoris: Secondary | ICD-10-CM | POA: Diagnosis not present

## 2019-08-13 DIAGNOSIS — Z713 Dietary counseling and surveillance: Secondary | ICD-10-CM | POA: Diagnosis not present

## 2019-08-13 DIAGNOSIS — Z6826 Body mass index (BMI) 26.0-26.9, adult: Secondary | ICD-10-CM | POA: Diagnosis not present

## 2019-08-17 ENCOUNTER — Ambulatory Visit: Payer: Medicare Other | Admitting: Physical Therapy

## 2019-08-17 ENCOUNTER — Encounter: Payer: Self-pay | Admitting: Physical Therapy

## 2019-08-17 ENCOUNTER — Other Ambulatory Visit: Payer: Self-pay

## 2019-08-17 DIAGNOSIS — M545 Low back pain, unspecified: Secondary | ICD-10-CM

## 2019-08-17 DIAGNOSIS — R293 Abnormal posture: Secondary | ICD-10-CM

## 2019-08-17 DIAGNOSIS — G8929 Other chronic pain: Secondary | ICD-10-CM

## 2019-08-17 NOTE — Therapy (Signed)
[facility] [place], [place], [address] Phone: [phone]   Fax:  [phone]  Physical Therapy Treatment  Patient Details  Name: Dalton Phillips MRN: [id number] Date of Birth: [birth year] Referring Provider (PT): Laroy Apple, MD   Encounter Date: 08/17/2019  PT End of Session - 08/17/19 1440    Visit Number  14    Number of Visits  20    Date for PT Re-Evaluation  08/19/19    Authorization Type  FOTO; progress note every 10th visit; KX modifier every 15th visit    12th visit FOTO 39% limited    PT Start Time  1433    PT Stop Time  1518    PT Time Calculation (min)  45 min    Activity Tolerance  Patient tolerated treatment well    Behavior During Therapy  [facility] for tasks assessed/performed       Past Medical History:  Diagnosis Date  . CAD (coronary artery disease)    LAD disease,otherwise nonobstructive,LVEF normal  . Herpes zoster   . Hyperlipidemia   . Myocardial infarction [facility])    NSTEMI 05/10    Past Surgical History:  Procedure Laterality Date  . CORONARY ARTERY BYPASS GRAFT  04-04-09   Bryan Bartle,MD-off pump LIMA to LAD    There were no vitals filed for this visit.  Subjective Assessment - 08/17/19 1439    Subjective  COVID 19 screening performed on patient upon arrival. Patient reports playing a round of golf with no pain this morning.    Limitations  Walking;House hold activities;Standing    Diagnostic tests  X-ray & MRI: unsure of results "a lot of spine problems"    Patient Stated Goals  stop pain, do activities better    Currently in Pain?  No/denies         [facility] PT Assessment - 08/17/19 0001      Assessment   Medical Diagnosis  right LBP, facet mediated pain with lumbar spondylosis    Referring Provider (PT)  Laroy Apple, MD    Next MD Visit  08/19/2019      Precautions   Precautions  None                   [facility] Adult PT Treatment/Exercise - 08/17/19 0001      Lumbar Exercises: Stretches   Passive Hamstring Stretch  Right;Left;3 reps;30 seconds    Single Knee to Chest Stretch  Right;Left;3 reps;30 seconds      Lumbar Exercises: Supine   Clam  20 reps;2 seconds    Clam Limitations  green theraband    Bent Knee Raise  20 reps;2 seconds    Bent Knee Raise Limitations  with green theraband    Other Supine Lumbar Exercises  hip adduction ball squeeze 5" hold x20      Modalities   Modalities  Electrical Stimulation;Moist Heat;Ultrasound      Moist Heat Therapy   Number Minutes Moist Heat  15 Minutes    Moist Heat Location  Lumbar Spine      Electrical Stimulation   Electrical Stimulation Location  B low back    Electrical Stimulation Action  pre-mod    Electrical Stimulation Parameters  80-150 hz x10 mins    Electrical Stimulation Goals  Pain      Ultrasound   Ultrasound Location  R low back    Ultrasound Parameters  1.5 w/cm2 100% 1 mhz     Ultrasound Goals  Pain  PT Long Term Goals - 08/06/19 1655      PT LONG TERM GOAL #1   Title  Patient will be independent with HEP    Time  6    Period  Weeks    Status  Achieved      PT LONG TERM GOAL #2   Title  Patient will demonstrate 4/5 or greater bilateral hip MMT to improve stability during functional tasks.    Time  6    Period  Weeks    Status  On-going      PT LONG TERM GOAL #3   Title  Patient will report ability to perform ADLs with low back pain less than or equal to 3/10    Time  6    Period  Weeks    Status  Achieved      PT LONG TERM GOAL #4   Title  Patient will report ability to transition from golf cart with low back pain less than or equal to 3/10    Time  6    Period  Weeks    Status  Achieved            Plan - 08/17/19 1443    Clinical Impression Statement  Patient reports ongoing pain with prolonged standing but has no pain with golfing. Patient guided through stretching and exercises with no complaints. Patient denied pain  with ultrasound. No adverse afffects upon removal of modalities.    Personal Factors and Comorbidities  Age;Time since onset of injury/illness/exacerbation    Examination-Activity Limitations  Stand;Transfers    Stability/Clinical Decision Making  Stable/Uncomplicated    Clinical Decision Making  Low    Rehab Potential  Good    PT Frequency  2x / week    PT Duration  6 weeks    PT Treatment/Interventions  ADLs/Self Care Home Management;Electrical Stimulation;Cryotherapy;Therapeutic activities;Functional mobility training;Therapeutic exercise;Balance training;Gait training;Stair training;Moist Heat;Neuromuscular re-education;Patient/family education;Dry needling;Passive range of motion;Taping;Spinal Manipulations;Ultrasound    PT Next Visit Plan  continue plan of care of gentle stretching, combo us/e-stim, and e-stim for pain relief.    Consulted and Agree with Plan of Care  Patient       Patient will benefit from skilled therapeutic intervention in order to improve the following deficits and impairments:  Decreased balance, Pain, Postural dysfunction, Decreased range of motion, Decreased strength, Difficulty walking  Visit Diagnosis: Chronic bilateral low back pain, unspecified whether sciatica present  Abnormal posture     Problem List Patient Active Problem List   Diagnosis Date Noted  . 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 CORONARY ATHEROSCLEROSIS NATIVE CORONARY ARTERY 06/03/2009    Gabriela Eves, PT,DPT 08/17/2019, 3:32 PM  [facility] [address] [place], [place], [address] Phone: [phone]   Fax:  [phone]  Name: Dalton Phillips MRN: [id number] Date of Birth: [birth year]

## 2019-08-18 ENCOUNTER — Encounter: Payer: Medicare Other | Admitting: *Deleted

## 2019-08-19 DIAGNOSIS — M25551 Pain in right hip: Secondary | ICD-10-CM | POA: Diagnosis not present

## 2019-08-20 ENCOUNTER — Other Ambulatory Visit: Payer: Self-pay

## 2019-08-20 ENCOUNTER — Ambulatory Visit: Payer: Medicare Other | Attending: Physical Medicine and Rehabilitation | Admitting: *Deleted

## 2019-08-20 DIAGNOSIS — R293 Abnormal posture: Secondary | ICD-10-CM | POA: Insufficient documentation

## 2019-08-20 DIAGNOSIS — M545 Low back pain, unspecified: Secondary | ICD-10-CM

## 2019-08-20 DIAGNOSIS — G8929 Other chronic pain: Secondary | ICD-10-CM | POA: Diagnosis not present

## 2019-08-20 NOTE — Therapy (Signed)
[facility] [place], [place], [address] Phone: [phone]   Fax:  [phone]  Physical Therapy Treatment  Patient Details  Name: Dalton Phillips MRN: [id number] Date of Birth: 09-Feb-1941 Referring Provider (PT): Laroy Apple, MD   Encounter Date: 08/20/2019  PT End of Session - 08/20/19 1616    Visit Number  15    Number of Visits  20    Date for PT Re-Evaluation  08/19/19    Authorization Type  FOTO; progress note every 10th visit; KX modifier every 15th visit    12th visit FOTO 39% limited    PT Start Time  1517    PT Stop Time  1605    PT Time Calculation (min)  48 min       Past Medical History:  Diagnosis Date  . CAD (coronary artery disease)    LAD disease,otherwise nonobstructive,LVEF normal  . Herpes zoster   . Hyperlipidemia   . Myocardial infarction [facility])    NSTEMI 05/10    Past Surgical History:  Procedure Laterality Date  . CORONARY ARTERY BYPASS GRAFT  04-04-09   Bryan Bartle,MD-off pump LIMA to LAD    There were no vitals filed for this visit.  Subjective Assessment - 08/20/19 1514    Subjective  COVID 19 screening performed on patient upon arrival. Had an injection in the RT hip yesterday. LBP is stiff    Limitations  Walking;House hold activities;Standing    Diagnostic tests  X-ray & MRI: unsure of results "a lot of spine problems"    Patient Stated Goals  stop pain, do activities better    Currently in Pain?  No/denies    Pain Score  2     Pain Location  Back    Pain Orientation  Right    Pain Descriptors / Indicators  Tightness    Pain Onset  More than a month ago                       [facility] Adult PT Treatment/Exercise - 08/20/19 0001      Knee/Hip Exercises: Stretches   Active Hamstring Stretch  Left;5 reps   and calf stretch     Modalities   Modalities  Electrical Stimulation;Moist Heat;Ultrasound      Moist Heat Therapy   Number Minutes Moist Heat  15  Minutes    Moist Heat Location  Lumbar Spine      Electrical Stimulation   Electrical Stimulation Location  Bil. LB paras    Electrical Stimulation Action  IFC    Electrical Stimulation Parameters  80-150hz  x 15 mins    Electrical Stimulation Goals  Pain      Ultrasound   Ultrasound Location  RT / LT LB paras    Ultrasound Parameters  1.5 w/cm2 x 12 mins    Ultrasound Goals  Pain      Manual Therapy   Manual Therapy  Soft tissue mobilization    Soft tissue mobilization  STW to R lumbar paraspinals/ QL to reduce muscle tightness                   PT Long Term Goals - 08/06/19 1655      PT LONG TERM GOAL #1   Title  Patient will be independent with HEP    Time  6    Period  Weeks    Status  Achieved      PT LONG TERM GOAL #2  Title  Patient will demonstrate 4/5 or greater bilateral hip MMT to improve stability during functional tasks.    Time  6    Period  Weeks    Status  On-going      PT LONG TERM GOAL #3   Title  Patient will report ability to perform ADLs with low back pain less than or equal to 3/10    Time  6    Period  Weeks    Status  Achieved      PT LONG TERM GOAL #4   Title  Patient will report ability to transition from golf cart with low back pain less than or equal to 3/10    Time  6    Period  Weeks    Status  Achieved            Plan - 08/20/19 1605    Clinical Impression Statement  Pt arrived today reporting mainly stiffness/soreness in both LB paras today and LT calf tightness. Pt was instructed in LT calf and HS stretch and did well. [place] and STW performed to Both side LB paras and SIJs with notable soreness LT SIJ. Normal modality response today    Personal Factors and Comorbidities  Age;Time since onset of injury/illness/exacerbation    Examination-Activity Limitations  Stand;Transfers    Stability/Clinical Decision Making  Stable/Uncomplicated    Rehab Potential  Good    PT Frequency  2x / week    PT Duration  6 weeks    PT  Treatment/Interventions  ADLs/Self Care Home Management;Electrical Stimulation;Cryotherapy;Therapeutic activities;Functional mobility training;Therapeutic exercise;Balance training;Gait training;Stair training;Moist Heat;Neuromuscular re-education;Patient/family education;Dry needling;Passive range of motion;Taping;Spinal Manipulations;Ultrasound    PT Next Visit Plan  continue plan of care of gentle stretching, combo us/e-stim, and e-stim for pain relief.    PT Home Exercise Plan  see patient education section       Patient will benefit from skilled therapeutic intervention in order to improve the following deficits and impairments:  Decreased balance, Pain, Postural dysfunction, Decreased range of motion, Decreased strength, Difficulty walking  Visit Diagnosis: Chronic bilateral low back pain, unspecified whether sciatica present  Abnormal posture     Problem List Patient Active Problem List   Diagnosis Date Noted  . 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 CORONARY ATHEROSCLEROSIS NATIVE CORONARY ARTERY 06/03/2009    Ekin Pilar,CHRIS, PTA 08/20/2019, 4:18 PM  [facility] [address] [place], [place], [address] Phone: [phone]   Fax:  [phone]  Name: Dalton Phillips MRN: [id number] Date of Birth: 1941-02-07

## 2019-08-25 ENCOUNTER — Ambulatory Visit: Payer: Medicare Other | Admitting: Physical Therapy

## 2019-08-27 ENCOUNTER — Other Ambulatory Visit: Payer: Self-pay

## 2019-08-27 ENCOUNTER — Encounter: Payer: Self-pay | Admitting: Physical Therapy

## 2019-08-27 ENCOUNTER — Ambulatory Visit: Payer: Medicare Other | Admitting: Physical Therapy

## 2019-08-27 DIAGNOSIS — G8929 Other chronic pain: Secondary | ICD-10-CM | POA: Diagnosis not present

## 2019-08-27 DIAGNOSIS — R293 Abnormal posture: Secondary | ICD-10-CM

## 2019-08-27 DIAGNOSIS — M545 Low back pain, unspecified: Secondary | ICD-10-CM

## 2019-08-27 NOTE — Therapy (Signed)
[facility] [place], [place], [address] Phone: [phone]   Fax:  [phone]  Physical Therapy Treatment  Patient Details  Name: Dalton Phillips MRN: [id number] Date of Birth: Sep 16, 1941 Referring Provider (PT): Laroy Apple, MD   Encounter Date: 08/27/2019  PT End of Session - 08/27/19 1524    Visit Number  16    Number of Visits  20    Date for PT Re-Evaluation  09/01/19   per order   Authorization Type  FOTO; progress note every 10th visit; KX modifier every 15th visit    12th visit FOTO 39% limited    PT Start Time  0315    PT Stop Time  0355    PT Time Calculation (min)  40 min    Activity Tolerance  Patient tolerated treatment well    Behavior During Therapy  [facility] for tasks assessed/performed       Past Medical History:  Diagnosis Date  . CAD (coronary artery disease)    LAD disease,otherwise nonobstructive,LVEF normal  . Herpes zoster   . Hyperlipidemia   . Myocardial infarction [facility])    NSTEMI 05/10    Past Surgical History:  Procedure Laterality Date  . CORONARY ARTERY BYPASS GRAFT  04-04-09   Bryan Bartle,MD-off pump LIMA to LAD    There were no vitals filed for this visit.  Subjective Assessment - 08/27/19 1517    Subjective  COVID 19 screening performed on patient upon arrival. Patient reported ongoing stiffness    Limitations  Walking;House hold activities;Standing    Diagnostic tests  X-ray & MRI: unsure of results "a lot of spine problems"    Patient Stated Goals  stop pain, do activities better    Currently in Pain?  Yes    Pain Score  5     Pain Location  Back    Pain Orientation  Right;Left    Pain Descriptors / Indicators  --   stiffness   Pain Type  Chronic pain    Pain Frequency  Intermittent    Aggravating Factors   prolong walking    Pain Relieving Factors  rest         [facility] PT Assessment - 08/27/19 0001      Strength   Strength Assessment Site  Hip    Right/Left Hip   Right;Left    Right Hip ABduction  4+/5    Left Hip ABduction  4/5                   [facility] Adult PT Treatment/Exercise - 08/27/19 0001      Moist Heat Therapy   Number Minutes Moist Heat  15 Minutes    Moist Heat Location  Lumbar Spine      Electrical Stimulation   Electrical Stimulation Location  Bil. LB paras    Electrical Stimulation Action  IFC    Electrical Stimulation Parameters  80-'150hz'$  x12mn    Electrical Stimulation Goals  Pain      Ultrasound   Ultrasound Location  bil low back paraspinals    Ultrasound Parameters  1.5w/cm2/100%/111m x1238m   Ultrasound Goals  Pain      Manual Therapy   Manual Therapy  Soft tissue mobilization    Soft tissue mobilization  STW to bil lumbar paraspinals/ QL to reduce muscle tightness                   PT Long Term Goals - 08/27/19 1526  PT LONG TERM GOAL #1   Title  Patient will be independent with HEP    Time  6    Period  Weeks    Status  Achieved      PT LONG TERM GOAL #2   Title  Patient will demonstrate 4/5 or greater bilateral hip MMT to improve stability during functional tasks.    Time  6    Period  Weeks    Status  Achieved   08/27/19 see assesment     PT LONG TERM GOAL #3   Title  Patient will report ability to perform ADLs with low back pain less than or equal to 3/10    Time  6    Period  Weeks    Status  Achieved      PT LONG TERM GOAL #4   Title  Patient will report ability to transition from golf cart with low back pain less than or equal to 3/10    Time  6    Period  Weeks    Status  Achieved            Plan - 08/27/19 1547    Clinical Impression Statement  Patient tolerated treatment well today. patient has improved with bil hip strength and has ongoing stiffness in bil mid/low back. Patient has met all current goals. Patient is able to perform ADL's yet limited with prolong walking. Patient refused any exercises and wants to continue with therapy for STW and  modalites due to relief. Dissussed with PT on cont POC then DC to HEP.    Personal Factors and Comorbidities  Comorbidity 1    Examination-Activity Limitations  Stand;Transfers    Stability/Clinical Decision Making  Stable/Uncomplicated    Rehab Potential  Good    PT Frequency  2x / week    PT Duration  6 weeks    PT Treatment/Interventions  ADLs/Self Care Home Management;Electrical Stimulation;Cryotherapy;Therapeutic activities;Functional mobility training;Therapeutic exercise;Balance training;Gait training;Stair training;Moist Heat;Neuromuscular re-education;Patient/family education;Dry needling;Passive range of motion;Taping;Spinal Manipulations;Ultrasound    PT Next Visit Plan  continue plan of care of gentle stretching, STW, combo us/e-stim, and e-stim for pain relief.    Consulted and Agree with Plan of Care  Patient       Patient will benefit from skilled therapeutic intervention in order to improve the following deficits and impairments:  Decreased balance, Pain, Postural dysfunction, Decreased range of motion, Decreased strength, Difficulty walking  Visit Diagnosis: Chronic bilateral low back pain, unspecified whether sciatica present  Abnormal posture     Problem List Patient Active Problem List   Diagnosis Date Noted  . 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 CORONARY ATHEROSCLEROSIS NATIVE CORONARY ARTERY 06/03/2009    Dalton Phillips, PTA 08/27/19 3:57 PM  [facility] [address] [place], [place], [address] Phone: [phone]   Fax:  [phone]  Name: Dalton Phillips MRN: [id number] Date of Birth: 1941/08/08

## 2019-09-03 ENCOUNTER — Ambulatory Visit: Payer: Medicare Other | Admitting: *Deleted

## 2019-09-03 ENCOUNTER — Other Ambulatory Visit: Payer: Self-pay

## 2019-09-03 ENCOUNTER — Encounter: Payer: Self-pay | Admitting: *Deleted

## 2019-09-03 DIAGNOSIS — G8929 Other chronic pain: Secondary | ICD-10-CM

## 2019-09-03 DIAGNOSIS — M545 Low back pain: Secondary | ICD-10-CM

## 2019-09-03 DIAGNOSIS — R293 Abnormal posture: Secondary | ICD-10-CM

## 2019-09-03 NOTE — Therapy (Signed)
[facility] [place], [place], [address] Phone: [phone]   Fax:  [phone]  Physical Therapy Treatment  Patient Details  Name: Dalton Phillips MRN: [id number] Date of Birth: 1941/01/17 Referring Provider (PT): Laroy Apple, MD   Encounter Date: 09/03/2019  PT End of Session - 09/03/19 1612    Visit Number  17    Number of Visits  20    Date for PT Re-Evaluation  09/01/19    Authorization Type  FOTO; progress note every 10th visit; KX modifier every 15th visit    12th visit FOTO 39% limited    PT Start Time  1600    PT Stop Time  1649    PT Time Calculation (min)  49 min       Past Medical History:  Diagnosis Date  . CAD (coronary artery disease)    LAD disease,otherwise nonobstructive,LVEF normal  . Herpes zoster   . Hyperlipidemia   . Myocardial infarction [facility])    NSTEMI 05/10    Past Surgical History:  Procedure Laterality Date  . CORONARY ARTERY BYPASS GRAFT  04-04-09   Bryan Bartle,MD-off pump LIMA to LAD    There were no vitals filed for this visit.  Subjective Assessment - 09/03/19 1633    Subjective  COVID 19 screening performed on patient upon arrival. Patient reported ongoing stiffness on LT side    Limitations  Walking;House hold activities;Standing    Diagnostic tests  X-ray & MRI: unsure of results "a lot of spine problems"    Patient Stated Goals  stop pain, do activities better    Currently in Pain?  Yes    Pain Score  5     Pain Location  Back    Pain Orientation  Right;Left    Pain Descriptors / Indicators  Sore    Pain Type  Chronic pain    Pain Onset  More than a month ago                       [facility] Adult PT Treatment/Exercise - 09/03/19 0001      Modalities   Modalities  Electrical Stimulation;Moist Heat;Ultrasound      Moist Heat Therapy   Number Minutes Moist Heat  15 Minutes    Moist Heat Location  Lumbar Spine      Electrical Stimulation   Electrical Stimulation Location  Bil. LB paras    Electrical Stimulation Action  IFC    Electrical Stimulation Parameters  80-150hz  x 15 mins    Electrical Stimulation Goals  Pain      Ultrasound   Ultrasound Location   LT QL/LB paras    Ultrasound Parameters  1.5w/cm2 x 10 mins    Ultrasound Goals  Pain      Manual Therapy   Manual Therapy  Soft tissue mobilization    Soft tissue mobilization  STW to bil lumbar paraspinals/ LT QL to reduce muscle tightness  with Pt RT sidelying                  PT Long Term Goals - 08/27/19 1526      PT LONG TERM GOAL #1   Title  Patient will be independent with HEP    Time  6    Period  Weeks    Status  Achieved      PT LONG TERM GOAL #2   Title  Patient will demonstrate 4/5 or greater bilateral hip MMT to  improve stability during functional tasks.    Time  6    Period  Weeks    Status  Achieved   08/27/19 see assesment     PT LONG TERM GOAL #3   Title  Patient will report ability to perform ADLs with low back pain less than or equal to 3/10    Time  6    Period  Weeks    Status  Achieved      PT LONG TERM GOAL #4   Title  Patient will report ability to transition from golf cart with low back pain less than or equal to 3/10    Time  6    Period  Weeks    Status  Achieved      PT LONG TERM GOAL #5   Title  Patient will demonstrate proper lifting mechanics to protect spine during functional activities.    Time  6    Period  Weeks    Status  New            Plan - 09/03/19 1657    Clinical Impression Statement  Pt arrived today doing fairly well, but with LT side stiffness in LB. He did well with Rx, but had notable soreness in LT QL during STW. Good release end of Rx. Normal modality response    Personal Factors and Comorbidities  Comorbidity 1    Examination-Activity Limitations  Stand;Transfers    Stability/Clinical Decision Making  Stable/Uncomplicated    Rehab Potential  Good    PT Frequency  2x / week    PT  Duration  6 weeks    PT Treatment/Interventions  ADLs/Self Care Home Management;Electrical Stimulation;Cryotherapy;Therapeutic activities;Functional mobility training;Therapeutic exercise;Balance training;Gait training;Stair training;Moist Heat;Neuromuscular re-education;Patient/family education;Dry needling;Passive range of motion;Taping;Spinal Manipulations;Ultrasound    PT Next Visit Plan  continue plan of care of gentle stretching, STW, combo us/e-stim, and e-stim for pain relief.    PT Home Exercise Plan  see patient education section    Consulted and Agree with Plan of Care  Patient       Patient will benefit from skilled therapeutic intervention in order to improve the following deficits and impairments:  Decreased balance, Pain, Postural dysfunction, Decreased range of motion, Decreased strength, Difficulty walking  Visit Diagnosis: Chronic bilateral low back pain, unspecified whether sciatica present  Abnormal posture     Problem List Patient Active Problem List   Diagnosis Date Noted  . 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 CORONARY ATHEROSCLEROSIS NATIVE CORONARY ARTERY 06/03/2009    Daviona Herbert,CHRIS, PTA 09/03/2019, 5:29 PM  [facility] [address] [place], [place], [address] Phone: [phone]   Fax:  [phone]  Name: Dalton Phillips MRN: [id number] Date of Birth: Jul 25, 1941

## 2019-09-09 DIAGNOSIS — E7800 Pure hypercholesterolemia, unspecified: Secondary | ICD-10-CM | POA: Diagnosis not present

## 2019-09-09 DIAGNOSIS — I2510 Atherosclerotic heart disease of native coronary artery without angina pectoris: Secondary | ICD-10-CM | POA: Diagnosis not present

## 2019-09-10 ENCOUNTER — Ambulatory Visit: Payer: Medicare Other | Admitting: *Deleted

## 2019-09-10 ENCOUNTER — Other Ambulatory Visit: Payer: Self-pay

## 2019-09-10 ENCOUNTER — Encounter: Payer: Self-pay | Admitting: *Deleted

## 2019-09-10 DIAGNOSIS — G8929 Other chronic pain: Secondary | ICD-10-CM | POA: Diagnosis not present

## 2019-09-10 DIAGNOSIS — R293 Abnormal posture: Secondary | ICD-10-CM | POA: Diagnosis not present

## 2019-09-10 DIAGNOSIS — M545 Low back pain: Secondary | ICD-10-CM | POA: Diagnosis not present

## 2019-09-10 NOTE — Therapy (Addendum)
[facility] [place], [place], [address] Phone: [phone]   Fax:  [phone]  Physical Therapy Treatment PHYSICAL THERAPY DISCHARGE SUMMARY  Visits from Start of Care: 18  Current functional level related to goals / functional outcomes: See  below   Remaining deficits: See goals   Education / Equipment: HEP Plan: Patient agrees to discharge.  Patient goals were partially met. Patient is being discharged due to not returning since the last visit.  ?????     Patient Details  Name: Dalton Phillips MRN: [id number] Date of Birth: 1941/10/26 Referring Provider (PT): Laroy Apple, MD   Encounter Date: 09/10/2019  PT End of Session - 09/10/19 1623    Visit Number  18    Number of Visits  20    Date for PT Re-Evaluation  09/01/19    Authorization Type  FOTO; progress note every 10th visit; KX modifier every 15th visit    12th visit FOTO 39% limited    PT Start Time  1600    PT Stop Time  1649    PT Time Calculation (min)  49 min       Past Medical History:  Diagnosis Date  . CAD (coronary artery disease)    LAD disease,otherwise nonobstructive,LVEF normal  . Herpes zoster   . Hyperlipidemia   . Myocardial infarction [facility])    NSTEMI 05/10    Past Surgical History:  Procedure Laterality Date  . CORONARY ARTERY BYPASS GRAFT  04-04-09   Bryan Bartle,MD-off pump LIMA to LAD    There were no vitals filed for this visit.  Subjective Assessment - 09/10/19 1604    Subjective  COVID 19 screening performed on patient upon arrival. MY LT hip is hurting more. LB is mainly stiff    Limitations  Walking;House hold activities;Standing    Patient Stated Goals  stop pain, do activities better    Currently in Pain?  Yes    Pain Score  3     Pain Location  Back    Pain Orientation  Right;Left    Pain Onset  More than a month ago                       [facility] Adult PT Treatment/Exercise - 09/10/19 0001       Modalities   Modalities  Electrical Stimulation;Moist Heat;Ultrasound      Moist Heat Therapy   Number Minutes Moist Heat  15 Minutes    Moist Heat Location  Lumbar Spine      Electrical Stimulation   Electrical Stimulation Location  Bil. LB paras    Electrical Stimulation Action  IFC    Electrical Stimulation Parameters  80-150hhz x15 mins    Electrical Stimulation Goals  Pain      Ultrasound   Ultrasound Location  Bil LB paras    Ultrasound Parameters  combo 1.5 w/cm2 x 10 mins in RT sidelying    Ultrasound Goals  Pain      Manual Therapy   Manual Therapy  Soft tissue mobilization    Soft tissue mobilization  STW to bil lumbar paraspinals to reduce muscle tightness  with Pt RT sidelying                  PT Long Term Goals - 08/27/19 1526      PT LONG TERM GOAL #1   Title  Patient will be independent with HEP    Time  6  Period  Weeks    Status  Achieved      PT LONG TERM GOAL #2   Title  Patient will demonstrate 4/5 or greater bilateral hip MMT to improve stability during functional tasks.    Time  6    Period  Weeks    Status  Achieved   08/27/19 see assesment     PT LONG TERM GOAL #3   Title  Patient will report ability to perform ADLs with low back pain less than or equal to 3/10    Time  6    Period  Weeks    Status  Achieved      PT LONG TERM GOAL #4   Title  Patient will report ability to transition from golf cart with low back pain less than or equal to 3/10    Time  6    Period  Weeks    Status  Achieved      PT LONG TERM GOAL #5   Title  Patient will demonstrate proper lifting mechanics to protect spine during functional activities.    Time  6    Period  Weeks    Status  New            Plan - 09/10/19 1647    Clinical Impression Statement  Pt arrived today doing fairly well with mainly LB stiffness, but minimal pain. His CC is pain in his LT hip and leg. He feels that he is going to try an injection in his LT hip since  having good results from RT hip injection. He did well with Rx today and has met majority of LTGs. He has 2 visits left and then DC to HEP    Personal Factors and Comorbidities  Comorbidity 1    Examination-Activity Limitations  Stand;Transfers    Stability/Clinical Decision Making  Stable/Uncomplicated    Rehab Potential  Good    PT Frequency  2x / week    PT Duration  6 weeks    PT Treatment/Interventions  ADLs/Self Care Home Management;Electrical Stimulation;Cryotherapy;Therapeutic activities;Functional mobility training;Therapeutic exercise;Balance training;Gait training;Stair training;Moist Heat;Neuromuscular re-education;Patient/family education;Dry needling;Passive range of motion;Taping;Spinal Manipulations;Ultrasound    PT Next Visit Plan  continue plan of care of gentle stretching, STW, combo us/e-stim, and e-stim for pain relief.    PT Home Exercise Plan  see patient education section    Consulted and Agree with Plan of Care  Patient       Patient will benefit from skilled therapeutic intervention in order to improve the following deficits and impairments:  Decreased balance, Pain, Postural dysfunction, Decreased range of motion, Decreased strength, Difficulty walking  Visit Diagnosis: Chronic bilateral low back pain, unspecified whether sciatica present  Abnormal posture     Problem List Patient Active Problem List   Diagnosis Date Noted  . 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 CORONARY ATHEROSCLEROSIS NATIVE CORONARY ARTERY 06/03/2009    Camesha Farooq,CHRIS , PTA 09/10/2019, 5:06 PM  [facility] [address] [place], [place], [address] Phone: [phone]   Fax:  [phone]  Name: Dalton Phillips MRN: [id number] Date of Birth: 1941-05-09

## 2019-09-15 DIAGNOSIS — E538 Deficiency of other specified B group vitamins: Secondary | ICD-10-CM | POA: Diagnosis not present

## 2019-09-15 DIAGNOSIS — Z713 Dietary counseling and surveillance: Secondary | ICD-10-CM | POA: Diagnosis not present

## 2019-09-15 DIAGNOSIS — Z299 Encounter for prophylactic measures, unspecified: Secondary | ICD-10-CM | POA: Diagnosis not present

## 2019-09-15 DIAGNOSIS — I2510 Atherosclerotic heart disease of native coronary artery without angina pectoris: Secondary | ICD-10-CM | POA: Diagnosis not present

## 2019-09-15 DIAGNOSIS — Z6826 Body mass index (BMI) 26.0-26.9, adult: Secondary | ICD-10-CM | POA: Diagnosis not present

## 2019-09-17 ENCOUNTER — Encounter: Payer: Medicare Other | Admitting: *Deleted

## 2019-09-22 DIAGNOSIS — M25551 Pain in right hip: Secondary | ICD-10-CM | POA: Diagnosis not present

## 2019-09-22 DIAGNOSIS — M545 Low back pain: Secondary | ICD-10-CM | POA: Diagnosis not present

## 2019-09-22 DIAGNOSIS — M25552 Pain in left hip: Secondary | ICD-10-CM | POA: Diagnosis not present

## 2019-09-24 DIAGNOSIS — M25552 Pain in left hip: Secondary | ICD-10-CM | POA: Diagnosis not present

## 2019-10-21 DIAGNOSIS — E538 Deficiency of other specified B group vitamins: Secondary | ICD-10-CM | POA: Diagnosis not present

## 2019-10-21 DIAGNOSIS — I2510 Atherosclerotic heart disease of native coronary artery without angina pectoris: Secondary | ICD-10-CM | POA: Diagnosis not present

## 2019-10-21 DIAGNOSIS — Z6825 Body mass index (BMI) 25.0-25.9, adult: Secondary | ICD-10-CM | POA: Diagnosis not present

## 2019-10-21 DIAGNOSIS — Z299 Encounter for prophylactic measures, unspecified: Secondary | ICD-10-CM | POA: Diagnosis not present

## 2019-10-21 DIAGNOSIS — M3500 Sicca syndrome, unspecified: Secondary | ICD-10-CM | POA: Diagnosis not present

## 2019-10-27 DIAGNOSIS — M545 Low back pain: Secondary | ICD-10-CM | POA: Diagnosis not present

## 2019-10-27 DIAGNOSIS — M5416 Radiculopathy, lumbar region: Secondary | ICD-10-CM | POA: Diagnosis not present

## 2019-11-06 ENCOUNTER — Emergency Department (HOSPITAL_COMMUNITY): Payer: Medicare Other

## 2019-11-06 ENCOUNTER — Encounter (HOSPITAL_COMMUNITY): Payer: Self-pay

## 2019-11-06 ENCOUNTER — Emergency Department (HOSPITAL_COMMUNITY)
Admission: EM | Admit: 2019-11-06 | Discharge: 2019-11-06 | Disposition: A | Discharge status: Home / Self Care | Payer: Medicare Other | Attending: Emergency Medicine | Admitting: Emergency Medicine

## 2019-11-06 ENCOUNTER — Other Ambulatory Visit: Payer: Self-pay

## 2019-11-06 DIAGNOSIS — R0602 Shortness of breath: Secondary | ICD-10-CM | POA: Insufficient documentation

## 2019-11-06 DIAGNOSIS — Z79899 Other long term (current) drug therapy: Secondary | ICD-10-CM | POA: Diagnosis not present

## 2019-11-06 DIAGNOSIS — Z87891 Personal history of nicotine dependence: Secondary | ICD-10-CM | POA: Diagnosis not present

## 2019-11-06 DIAGNOSIS — R0789 Other chest pain: Secondary | ICD-10-CM | POA: Diagnosis not present

## 2019-11-06 DIAGNOSIS — Z951 Presence of aortocoronary bypass graft: Secondary | ICD-10-CM | POA: Diagnosis not present

## 2019-11-06 DIAGNOSIS — Z7982 Long term (current) use of aspirin: Secondary | ICD-10-CM | POA: Diagnosis not present

## 2019-11-06 DIAGNOSIS — I2510 Atherosclerotic heart disease of native coronary artery without angina pectoris: Secondary | ICD-10-CM | POA: Insufficient documentation

## 2019-11-06 DIAGNOSIS — Z049 Encounter for examination and observation for unspecified reason: Secondary | ICD-10-CM | POA: Diagnosis not present

## 2019-11-06 DIAGNOSIS — R079 Chest pain, unspecified: Secondary | ICD-10-CM | POA: Diagnosis not present

## 2019-11-06 LAB — BASIC METABOLIC PANEL WITH GFR
Anion gap: 7 (ref 5–15)
BUN: 17 mg/dL (ref 8–23)
CO2: 25 mmol/L (ref 22–32)
Calcium: 9 mg/dL (ref 8.9–10.3)
Chloride: 106 mmol/L (ref 98–111)
Creatinine, Ser: 1.05 mg/dL (ref 0.61–1.24)
GFR calc Af Amer: >60 mL/min
GFR calc non Af Amer: >60 mL/min
Glucose, Bld: 117 mg/dL — ABNORMAL HIGH (ref 70–99)
Potassium: 3.6 mmol/L (ref 3.5–5.1)
Sodium: 138 mmol/L (ref 135–145)

## 2019-11-06 LAB — CBC
HCT: 34.8 % — ABNORMAL LOW (ref 39.0–52.0)
Hemoglobin: 11.6 g/dL — ABNORMAL LOW (ref 13.0–17.0)
MCH: 33.1 pg (ref 26.0–34.0)
MCHC: 33.3 g/dL (ref 30.0–36.0)
MCV: 99.4 fL (ref 80.0–100.0)
Platelets: 174 10*3/uL (ref 150–400)
RBC: 3.5 MIL/uL — ABNORMAL LOW (ref 4.22–5.81)
RDW: 12.9 % (ref 11.5–15.5)
WBC: 9.2 10*3/uL (ref 4.0–10.5)
nRBC: 0 % (ref 0.0–0.2)

## 2019-11-06 LAB — TROPONIN I (HIGH SENSITIVITY)
Troponin I (High Sensitivity): 7 ng/L
Troponin I (High Sensitivity): 7 ng/L (ref <18)

## 2019-11-06 NOTE — Discharge Instructions (Addendum)
Continue taking your medications as prescribed.  Please follow-up with cardiology or your primary care provider as soon as possible (preferably within 1 to 2 weeks) for reevaluation of your symptoms and for further work-up of your chest pains.  They may want to do an echocardiogram and a stress test or even a cardiac catheterization to assess if you have any blockages or any damage to the heart.  Return to the emergency department immediately if any concerning signs or symptoms develop such as worsening pain, shortness of breath, high fevers, persistent vomiting, or loss of consciousness.

## 2019-11-06 NOTE — ED Triage Notes (Signed)
Pt brought to ED via [facility] for mid chest pain since 1200, non radiating. Pt denies nausea, dizziness. Pt given 4 Nitro spray PTA, 300 ml NS bolus, and now has no pain.

## 2019-11-06 NOTE — ED Provider Notes (Signed)
[facility] EMERGENCY DEPARTMENT Provider Note   CSN: [id number] Arrival date & time: 11/06/19  1725     History Chief Complaint  Patient presents with  . Chest Pain    Dalton Phillips is a 78 y.o. male with history of CAD, prior MI status post CABG in 2010, hyperlipidemia presents for evaluation of acute onset, resolved episode of chest pain.  Reports symptoms began around noon or 1 PM after eating soup for lunch.  Reports substernal chest pressure which he described as "feeling like I really need to belch".  He reports he drank a carbonated beverage and walked around and attempted to belch but was unable to.  Pain was constant, did not radiate.  He felt as though he could not take a deep breath but denies shortness of breath.  No diaphoresis, lightheadedness, syncope, nausea, vomiting, or abdominal pain.  No aggravating or relieving factors identified.  Reports that symptoms worsened up to 8/10 in severity so he called EMS where he received 4 sprays of nitroglycerin with improvement in his symptoms with each dose.  Reports complete resolution of symptoms after 4 doses of nitroglycerin. Notes mild chest tightness but denies pain. He is a former smoker, denies excessive alcohol use.  Reports he has not been seen by his cardiologist in "a while".  The history is provided by the patient.       Past Medical History:  Diagnosis Date  . CAD (coronary artery disease)    LAD disease,otherwise nonobstructive,LVEF normal  . Herpes zoster   . Hyperlipidemia   . Myocardial infarction [facility])    NSTEMI 05/10    Patient Active Problem List   Diagnosis Date Noted  . 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 CORONARY ATHEROSCLEROSIS NATIVE CORONARY ARTERY 06/03/2009    Past Surgical History:  Procedure Laterality Date  . CORONARY ARTERY BYPASS GRAFT  04-04-09   Bryan Bartle,MD-off pump LIMA to LAD       Family History  Problem Relation Age of Onset  . Coronary  artery disease Other        family h/o    Social History   Tobacco Use  . Smoking status: Former Smoker    Packs/day: 1.50    Years: 40.00    Pack years: 60.00    Types: Cigarettes    Quit date: 03/19/2009    Years since quitting: 10.6  . Smokeless tobacco: Never Used  Substance Use Topics  . Alcohol use: Not on file  . Drug use: Not on file    Home Medications Prior to Admission medications   Medication Sig Start Date End Date Taking? Authorizing Provider  aspirin EC 81 MG tablet Take 81 mg by mouth daily.      [provider]  Cyanocobalamin (VITAMIN B-12) 2500 MCG SUBL Place 1 tablet under the tongue daily.    [provider]  nitroGLYCERIN (NITROSTAT) 0.4 MG SL tablet Place 1 tablet (0.4 mg total) under the tongue every 5 (five) minutes as needed. 11/22/11   Serpe, Burna Forts, PA-C  pravastatin (PRAVACHOL) 80 MG tablet Take 1 tablet (80 mg total) by mouth daily. 04/20/14   Satira Sark, MD  Tamsulosin HCl (FLOMAX) 0.4 MG CAPS Take 0.4 mg by mouth daily.      [provider]    Allergies    Patient has no known allergies.  Review of Systems   Review of Systems  Constitutional: Negative for chills and fever.  Respiratory: Positive for  shortness of breath.   Cardiovascular: Positive for chest pain. Negative for leg swelling.  Gastrointestinal: Negative for abdominal pain, nausea and vomiting.  Neurological: Negative for syncope and light-headedness.  All other systems reviewed and are negative.   Physical Exam Updated Vital Signs BP 124/79   Pulse (!) 58   Temp 98.7 F (37.1 C) (Oral)   Resp 18   Ht 5\' 7"  (1.702 m)   Wt 74.8 kg   SpO2 97%   BMI 25.84 kg/m   Physical Exam Vitals and nursing note reviewed.  Constitutional:      General: He is not in acute distress.    Appearance: He is well-developed.  HENT:     Head: Normocephalic and atraumatic.  Eyes:     General:        Right eye: No discharge.        Left eye: No  discharge.     Conjunctiva/sclera: Conjunctivae normal.  Neck:     Vascular: No JVD.     Trachea: No tracheal deviation.  Cardiovascular:     Rate and Rhythm: Normal rate and regular rhythm.     Pulses:          Radial pulses are 2+ on the right side and 2+ on the left side.       Dorsalis pedis pulses are 2+ on the right side and 2+ on the left side.       Posterior tibial pulses are 2+ on the right side and 2+ on the left side.     Comments: Homans sign absent bilaterally, no lower extremity edema, no palpable cords, compartments are soft  Pulmonary:     Effort: Pulmonary effort is normal.     Breath sounds: Normal breath sounds.  Abdominal:     General: Bowel sounds are normal. There is no distension.     Palpations: Abdomen is soft.     Tenderness: There is no abdominal tenderness. There is no guarding.  Musculoskeletal:     Cervical back: Normal range of motion and neck supple.     Right lower leg: No tenderness. No edema.     Left lower leg: No tenderness. No edema.  Skin:    General: Skin is warm and dry.     Findings: No erythema.  Neurological:     Mental Status: He is alert.  Psychiatric:        Behavior: Behavior normal.     ED Results / Procedures / Treatments   Labs (all labs ordered are listed, but only abnormal results are displayed) Labs Reviewed  BASIC METABOLIC PANEL - Abnormal; Notable for the following components:      Result Value   Glucose, Bld 117 (*)    All other components within normal limits  CBC - Abnormal; Notable for the following components:   RBC 3.50 (*)    Hemoglobin 11.6 (*)    HCT 34.8 (*)    All other components within normal limits  TROPONIN I (HIGH SENSITIVITY)  TROPONIN I (HIGH SENSITIVITY)    EKG EKG Interpretation  Date/Time:  Friday November 06 2019 17:50:20 EST Ventricular Rate:  68 PR Interval:    QRS Duration: 83 QT Interval:  376 QTC Calculation: 400 R Axis:   48 Text Interpretation: Sinus rhythm Ventricular  trigeminy No significant change was found Confirmed by Gerlene Fee [phone]) on 11/06/2019 6:21:48 PM   Radiology DG Chest 2 View  Result Date: 11/06/2019 CLINICAL DATA:  Mid chest pain. EXAM: CHEST -  2 VIEW COMPARISON:  Radiographs 12/17/2016 and 01/06/2015. FINDINGS: The heart size and mediastinal contours are stable status post median sternotomy. There is chronic pleural thickening on the right and calcified pulmonary granulomas bilaterally. Mild scarring in the lingula appears stable. No edema, confluent airspace opacity, significant pleural effusion or pneumothorax identified. The bones appear unchanged. Telemetry leads overlie the chest. IMPRESSION: Stable postoperative chest with chronic pleural thickening and calcified pulmonary granulomas. No acute cardiopulmonary process. Electronically Signed   By: Richardean Sale M.D.   On: 11/06/2019 18:49    Procedures Procedures (including critical care time)  Medications Ordered in ED Medications - No data to display  ED Course  I have reviewed the triage vital signs and the nursing notes.  Pertinent labs & imaging results that were available during my care of the patient were reviewed by me and considered in my medical decision making (see chart for details).    MDM Rules/Calculators/A&P                      Patient presenting for evaluation of chest pains which have resolved on my assessment.  He is afebrile, vital signs are stable.  He is nontoxic in appearance.  The pain was not pleuritic, exertional, or reproducible on palpation.  His EKG shows normal sinus rhythm but no acute ischemic abnormalities.  Lab work reviewed by me shows no leukocytosis, mild anemia, no metabolic derangements, no renal insufficiency.  Serial troponins are negative.  Low suspicion of PE, cardiac tamponade, esophageal rupture, pneumonia, pneumothorax, or dissection.  His chest x-ray showed stable postoperative changes and chronic pleural thickening and calcified  pulmonary granulomas but no acute abnormalities noted.  On reevaluation the patient is resting comfortably in no apparent distress.  He has been observed in the ED for several hours and has had no recurrence in his pain.  He has been seen by cardiology previously but it has been sometime.  He states he feels confident that he can follow-up with his PCP or a cardiologist this week for reevaluation and does not wish to be admitted for further cardiac work-up at this time.  We had a shared decision making conversation at this time I think this is reasonable given he appears hemodynamically stable, his pain is a little atypical, and he has the ability for close follow-up.  We discussed strict indications for return to the ED and he verbalized understanding of and agreement with this plan.  He appears stable for discharge at this time.  Discussed with Dr. Sedonia Small who agrees with assessment and plan at this time.   Final Clinical Impression(s) / ED Diagnoses Final diagnoses:  Atypical chest pain    Rx / DC Orders ED Discharge Orders         Ordered    Ambulatory referral to Cardiology    Comments: Chest pain, hx MI, no cards follow up in years   11/06/19 Enola, Caelan Branden A, PA-C 11/07/19 1808    Maudie Flakes, MD 11/10/19 1559

## 2019-11-10 DIAGNOSIS — I2510 Atherosclerotic heart disease of native coronary artery without angina pectoris: Secondary | ICD-10-CM | POA: Diagnosis not present

## 2019-11-10 DIAGNOSIS — E7800 Pure hypercholesterolemia, unspecified: Secondary | ICD-10-CM | POA: Diagnosis not present

## 2019-11-30 DIAGNOSIS — M25552 Pain in left hip: Secondary | ICD-10-CM | POA: Diagnosis not present

## 2019-12-02 ENCOUNTER — Ambulatory Visit: Payer: Medicare Other | Admitting: Cardiology

## 2019-12-07 ENCOUNTER — Encounter: Payer: Self-pay | Admitting: Cardiology

## 2019-12-07 NOTE — Progress Notes (Signed)
Cardiology Office Note  Date: 12/08/2019   ID: SOCTT OBROCHTA, DOB [birth year], MRN [id number]  PCP:  Glenda Chroman, MD  Consulting Cardiologist: Satira Sark, MD Electrophysiologist:  None   Chief Complaint  Patient presents with  . History of chest pain    History of Present Illness: Dalton Phillips is a 79 y.o. male referred to the office by Dr. Woody Seller for cardiology follow-up.  He was last seen in our practice back in 2013.  Records indicate recent ER visit at [facility] on December 18 with chest discomfort, ECG without acute ST segment changes and high-sensitivity troponin I levels negative.  He is here with his wife. He describes the symptoms from January as a dull ache, persistent and fairly intense, he thought that it might be indigestion and would get better if he could just "belch" one time. He had eaten some candy prior to this event. He took a nitroglycerin without relief, was given nitro spray by EMS on the way to the ER and symptoms resolved. He has had no recurrence since that time, no exertional breathlessness or palpitations.  He has been taking atorvastatin, not taking aspirin. Reports lipid follow-up with PCP, we are requesting the results.  He has been having left hip pain, has MRI planned and thinks that he may need a hip replacement eventually.  Past Medical History:  Diagnosis Date  . CAD (coronary artery disease)    Off-pump LIMA to LAD 2010  . Herpes zoster   . Hyperlipidemia   . Myocardial infarction [facility])    NSTEMI 05/10    Past Surgical History:  Procedure Laterality Date  . CORONARY ARTERY BYPASS GRAFT  04-04-09   Bryan Bartle,MD-off pump LIMA to LAD    Current Outpatient Medications  Medication Sig Dispense Refill  . ATORVASTATIN CALCIUM PO Take by mouth.    . Cyanocobalamin (VITAMIN B-12) 2500 MCG SUBL Place 1 tablet under the tongue daily.    Marland Kitchen FINASTERIDE PO Take by mouth.    . MELOXICAM PO Take by mouth.    . nitroGLYCERIN  (NITROSTAT) 0.4 MG SL tablet Place 1 tablet (0.4 mg total) under the tongue every 5 (five) minutes x 3 doses as needed for chest pain (if no relief after 3rd dose, proceed to ED for an evaluation or call 911). 25 tablet 2  . Tamsulosin HCl (FLOMAX) 0.4 MG CAPS Take 0.4 mg by mouth daily.      Marland Kitchen aspirin EC 81 MG tablet Take 1 tablet (81 mg total) by mouth daily. 90 tablet 3   No current facility-administered medications for this visit.   Allergies:  Patient has no known allergies.   Social History: The patient  reports that he quit smoking about 10 years ago. His smoking use included cigarettes. He has a 60.00 pack-year smoking history. He has never used smokeless tobacco. He reports that he does not drink alcohol or use drugs.   Family History: The patient's family history includes Coronary artery disease in an other family member.   ROS:  Please see the history of present illness. Otherwise, complete review of systems is positive for none.  All other systems are reviewed and negative.   Physical Exam: VS:  BP 130/60   Pulse 65   Ht 5\' 7"  (1.702 m)   Wt 170 lb (77.1 kg)   SpO2 98%   BMI 26.63 kg/m , BMI Body mass index is 26.63 kg/m.  Wt Readings from Last 3 Encounters:  12/08/19 170 lb (77.1 kg)  11/06/19 165 lb (74.8 kg)  07/29/12 170 lb 12.8 oz (77.5 kg)    General: Elderly male, appears comfortable at rest. HEENT: Conjunctiva and lids normal, wearing a mask. Neck: Supple, no elevated JVP or carotid bruits, no thyromegaly. Lungs: Clear to auscultation, nonlabored breathing at rest. Cardiac: Regular rate and rhythm, no S3, soft systolic murmur, no pericardial rub. Abdomen: Soft, nontender, bowel sounds present. Extremities: No pitting edema, distal pulses 2+. Skin: Warm and dry. Musculoskeletal: No kyphosis. Neuropsychiatric: Alert and oriented x3, affect grossly appropriate.  ECG:  An ECG dated 11/06/2019 was personally reviewed today and demonstrated:  Sinus rhythm with  PVCs.  Recent Labwork: 11/06/2019: BUN 17; Creatinine, Ser 1.05; Hemoglobin 11.6; Platelets 174; Potassium 3.6; Sodium 138   Other Studies Reviewed Today:  Chest x-ray 09/06/2019: FINDINGS: The heart size and mediastinal contours are stable status post median sternotomy. There is chronic pleural thickening on the right and calcified pulmonary granulomas bilaterally. Mild scarring in the lingula appears stable. No edema, confluent airspace opacity, significant pleural effusion or pneumothorax identified. The bones appear unchanged. Telemetry leads overlie the chest.  IMPRESSION: Stable postoperative chest with chronic pleural thickening and calcified pulmonary granulomas. No acute cardiopulmonary process.  Assessment and Plan:  1. Episode of prolonged chest discomfort back in mid Dec 2020 as described above. No evidence of ACS by ECG or high-sensitivity troponin I levels. He has had no recurrence since that time. Possibility of GI source such as esophageal spasm could be considered with nitrate responsivity, but he has not undergone any recent ischemic testing since prior CABG. Plan will be to obtain a Lexiscan Myoview to assess ischemic burden.  2. CAD status post off-pump LIMA to LAD in 2010. I asked him to resume aspirin 81 mg daily, continue atorvastatin, refill provided for as needed nitroglycerin. Follow-up ischemic testing as discussed above.  3. Mixed hyperlipidemia, on atorvastatin. Requesting lab work from PCP.  Medication Adjustments/Labs and Tests Ordered: Current medicines are reviewed at length with the patient today.  Concerns regarding medicines are outlined above.   Tests Ordered: Orders Placed This Encounter  Procedures  . NM Myocar Multi W/Spect W/Wall Motion / EF    Medication Changes: Meds ordered this encounter  Medications  . nitroGLYCERIN (NITROSTAT) 0.4 MG SL tablet    Sig: Place 1 tablet (0.4 mg total) under the tongue every 5 (five) minutes x 3  doses as needed for chest pain (if no relief after 3rd dose, proceed to ED for an evaluation or call 911).    Dispense:  25 tablet    Refill:  2  . aspirin EC 81 MG tablet    Sig: Take 1 tablet (81 mg total) by mouth daily.    Dispense:  90 tablet    Refill:  3    12/08/2019 NEW    Disposition:  Follow up test results and determine disposition.  Signed, Satira Sark, MD, [facility] 12/08/2019 11:17 AM    [facility] [address], [place], [place] [address] Phone: [phone]; Fax: [phone]

## 2019-12-08 ENCOUNTER — Telehealth: Payer: Self-pay | Admitting: Cardiology

## 2019-12-08 ENCOUNTER — Other Ambulatory Visit: Payer: Self-pay

## 2019-12-08 ENCOUNTER — Encounter: Payer: Self-pay | Admitting: Cardiology

## 2019-12-08 ENCOUNTER — Encounter: Payer: Self-pay | Admitting: *Deleted

## 2019-12-08 ENCOUNTER — Ambulatory Visit (INDEPENDENT_AMBULATORY_CARE_PROVIDER_SITE_OTHER): Payer: Medicare Other | Admitting: Cardiology

## 2019-12-08 VITALS — BP 130/60 | HR 65 | Ht 67.0 in | Wt 170.0 lb

## 2019-12-08 DIAGNOSIS — I2510 Atherosclerotic heart disease of native coronary artery without angina pectoris: Secondary | ICD-10-CM | POA: Diagnosis not present

## 2019-12-08 DIAGNOSIS — E782 Mixed hyperlipidemia: Secondary | ICD-10-CM

## 2019-12-08 DIAGNOSIS — I25119 Atherosclerotic heart disease of native coronary artery with unspecified angina pectoris: Secondary | ICD-10-CM | POA: Diagnosis not present

## 2019-12-08 DIAGNOSIS — E7800 Pure hypercholesterolemia, unspecified: Secondary | ICD-10-CM | POA: Diagnosis not present

## 2019-12-08 MED ORDER — NITROGLYCERIN 0.4 MG SL SUBL: 0.4000 mg | SUBLINGUAL_TABLET | SUBLINGUAL | 2 refills | Status: AC | PRN

## 2019-12-08 MED ORDER — ASPIRIN EC 81 MG PO TBEC: 81.0000 mg | DELAYED_RELEASE_TABLET | Freq: Every day | ORAL | 3 refills | Status: DC

## 2019-12-08 NOTE — Telephone Encounter (Signed)
  Precert needed for: Lexiscan Myoview dx: CAD w/angina   Location: [facility]    Date: Dec 17, 2019

## 2019-12-08 NOTE — Patient Instructions (Addendum)
Medication Instructions:    Your physician has recommended you make the following change in your medication:   Start aspirin 81 mg by mouth daily  Continue other medications the same  Labwork:  NONE  Testing/Procedures: Your physician has requested that you have a lexiscan myoview. For further information please visit [email or web address]. Please follow instruction sheet, as given.  Follow-Up:  Your physician recommends that you schedule a follow-up appointment in: pending.  Any Other Special Instructions Will Be Listed Below (If Applicable).  If you need a refill on your cardiac medications before your next appointment, please call your pharmacy.

## 2019-12-09 DIAGNOSIS — M25552 Pain in left hip: Secondary | ICD-10-CM | POA: Diagnosis not present

## 2019-12-17 ENCOUNTER — Telehealth: Payer: Self-pay | Admitting: *Deleted

## 2019-12-17 ENCOUNTER — Encounter (HOSPITAL_COMMUNITY)
Admission: RE | Admit: 2019-12-17 | Discharge: 2019-12-17 | Disposition: A | Discharge status: Home / Self Care | Payer: Medicare Other | Source: Ambulatory Visit | Attending: Cardiology | Admitting: Cardiology

## 2019-12-17 ENCOUNTER — Encounter (HOSPITAL_BASED_OUTPATIENT_CLINIC_OR_DEPARTMENT_OTHER)
Admission: RE | Admit: 2019-12-17 | Discharge: 2019-12-17 | Disposition: A | Discharge status: Home / Self Care | Payer: Medicare Other | Source: Ambulatory Visit | Attending: Cardiology | Admitting: Cardiology

## 2019-12-17 ENCOUNTER — Other Ambulatory Visit: Payer: Self-pay

## 2019-12-17 DIAGNOSIS — I25119 Atherosclerotic heart disease of native coronary artery with unspecified angina pectoris: Secondary | ICD-10-CM | POA: Insufficient documentation

## 2019-12-17 LAB — NM MYOCAR MULTI W/SPECT W/WALL MOTION / EF
LV dias vol: 69 mL (ref 62–150)
LV sys vol: 19 mL
Peak HR: 113 {beats}/min
RATE: 0.31
Rest HR: 64 {beats}/min
SDS: 2
SRS: 2
SSS: 4
TID: 1.29

## 2019-12-17 MED ORDER — TECHNETIUM TC 99M TETROFOSMIN IV KIT
10.0000 | PACK | Freq: Once | INTRAVENOUS | Status: AC | PRN
  Administered 2019-12-17: 10.5 via INTRAVENOUS

## 2019-12-17 MED ORDER — REGADENOSON 0.4 MG/5ML IV SOLN
INTRAVENOUS | Status: AC
  Administered 2019-12-17: 0.4 mg via INTRAVENOUS
  Filled 2019-12-17: qty 5

## 2019-12-17 MED ORDER — TECHNETIUM TC 99M TETROFOSMIN IV KIT
30.0000 | PACK | Freq: Once | INTRAVENOUS | Status: AC | PRN
  Administered 2019-12-17: 31.9 via INTRAVENOUS

## 2019-12-17 MED ORDER — SODIUM CHLORIDE FLUSH 0.9 % IV SOLN
INTRAVENOUS | Status: AC
  Administered 2019-12-17: 10 mL via INTRAVENOUS
  Filled 2019-12-17: qty 10

## 2019-12-17 NOTE — Telephone Encounter (Signed)
-----   Message from Dalton Phillips, [place] sent at 12/17/2019  1:20 PM EST ----- Covering for Dr. Domenic Polite - Please let the patient know that his stress test showed no evidence of significant blockages and pumping function of the heart was normal.  Overall, a low risk study.  Please forward a copy of results to Glenda Chroman, MD.

## 2019-12-17 NOTE — Telephone Encounter (Signed)
Patient informed. Copy sent to PCP °

## 2019-12-24 DIAGNOSIS — Z299 Encounter for prophylactic measures, unspecified: Secondary | ICD-10-CM | POA: Diagnosis not present

## 2019-12-24 DIAGNOSIS — D692 Other nonthrombocytopenic purpura: Secondary | ICD-10-CM | POA: Diagnosis not present

## 2019-12-24 DIAGNOSIS — M3500 Sicca syndrome, unspecified: Secondary | ICD-10-CM | POA: Diagnosis not present

## 2019-12-24 DIAGNOSIS — L299 Pruritus, unspecified: Secondary | ICD-10-CM | POA: Diagnosis not present

## 2019-12-24 DIAGNOSIS — I25110 Atherosclerotic heart disease of native coronary artery with unstable angina pectoris: Secondary | ICD-10-CM | POA: Diagnosis not present

## 2019-12-24 DIAGNOSIS — Z6826 Body mass index (BMI) 26.0-26.9, adult: Secondary | ICD-10-CM | POA: Diagnosis not present

## 2020-01-21 DIAGNOSIS — Z299 Encounter for prophylactic measures, unspecified: Secondary | ICD-10-CM | POA: Diagnosis not present

## 2020-01-21 DIAGNOSIS — N1831 Chronic kidney disease, stage 3a: Secondary | ICD-10-CM | POA: Diagnosis not present

## 2020-01-21 DIAGNOSIS — Z6826 Body mass index (BMI) 26.0-26.9, adult: Secondary | ICD-10-CM | POA: Diagnosis not present

## 2020-01-21 DIAGNOSIS — E538 Deficiency of other specified B group vitamins: Secondary | ICD-10-CM | POA: Diagnosis not present

## 2020-01-21 DIAGNOSIS — B351 Tinea unguium: Secondary | ICD-10-CM | POA: Diagnosis not present

## 2020-01-22 DIAGNOSIS — E7800 Pure hypercholesterolemia, unspecified: Secondary | ICD-10-CM | POA: Diagnosis not present

## 2020-01-22 DIAGNOSIS — I2510 Atherosclerotic heart disease of native coronary artery without angina pectoris: Secondary | ICD-10-CM | POA: Diagnosis not present

## 2020-01-26 DIAGNOSIS — I2510 Atherosclerotic heart disease of native coronary artery without angina pectoris: Secondary | ICD-10-CM | POA: Diagnosis not present

## 2020-01-26 DIAGNOSIS — Z20822 Contact with and (suspected) exposure to covid-19: Secondary | ICD-10-CM | POA: Diagnosis not present

## 2020-01-26 DIAGNOSIS — Z87891 Personal history of nicotine dependence: Secondary | ICD-10-CM | POA: Diagnosis not present

## 2020-01-26 DIAGNOSIS — R079 Chest pain, unspecified: Secondary | ICD-10-CM | POA: Diagnosis not present

## 2020-01-26 DIAGNOSIS — R0602 Shortness of breath: Secondary | ICD-10-CM | POA: Diagnosis not present

## 2020-01-26 DIAGNOSIS — Z951 Presence of aortocoronary bypass graft: Secondary | ICD-10-CM | POA: Diagnosis not present

## 2020-01-26 DIAGNOSIS — I25118 Atherosclerotic heart disease of native coronary artery with other forms of angina pectoris: Secondary | ICD-10-CM | POA: Diagnosis not present

## 2020-01-26 DIAGNOSIS — E7800 Pure hypercholesterolemia, unspecified: Secondary | ICD-10-CM | POA: Diagnosis not present

## 2020-01-26 DIAGNOSIS — K219 Gastro-esophageal reflux disease without esophagitis: Secondary | ICD-10-CM | POA: Diagnosis not present

## 2020-01-26 DIAGNOSIS — I129 Hypertensive chronic kidney disease with stage 1 through stage 4 chronic kidney disease, or unspecified chronic kidney disease: Secondary | ICD-10-CM | POA: Diagnosis not present

## 2020-01-26 DIAGNOSIS — N1831 Chronic kidney disease, stage 3a: Secondary | ICD-10-CM | POA: Diagnosis not present

## 2020-01-26 DIAGNOSIS — Z791 Long term (current) use of non-steroidal anti-inflammatories (NSAID): Secondary | ICD-10-CM | POA: Diagnosis not present

## 2020-01-26 DIAGNOSIS — I252 Old myocardial infarction: Secondary | ICD-10-CM | POA: Diagnosis not present

## 2020-01-26 DIAGNOSIS — Z79899 Other long term (current) drug therapy: Secondary | ICD-10-CM | POA: Diagnosis not present

## 2020-01-26 DIAGNOSIS — M3500 Sicca syndrome, unspecified: Secondary | ICD-10-CM | POA: Diagnosis not present

## 2020-01-26 DIAGNOSIS — Z85828 Personal history of other malignant neoplasm of skin: Secondary | ICD-10-CM | POA: Diagnosis not present

## 2020-01-26 DIAGNOSIS — R0789 Other chest pain: Secondary | ICD-10-CM | POA: Diagnosis not present

## 2020-01-26 DIAGNOSIS — E785 Hyperlipidemia, unspecified: Secondary | ICD-10-CM | POA: Diagnosis not present

## 2020-01-26 DIAGNOSIS — E538 Deficiency of other specified B group vitamins: Secondary | ICD-10-CM | POA: Diagnosis not present

## 2020-01-26 DIAGNOSIS — Z87442 Personal history of urinary calculi: Secondary | ICD-10-CM | POA: Diagnosis not present

## 2020-01-27 DIAGNOSIS — R079 Chest pain, unspecified: Secondary | ICD-10-CM | POA: Diagnosis not present

## 2020-01-27 DIAGNOSIS — R0789 Other chest pain: Secondary | ICD-10-CM | POA: Diagnosis not present

## 2020-01-27 DIAGNOSIS — Z87442 Personal history of urinary calculi: Secondary | ICD-10-CM | POA: Diagnosis not present

## 2020-01-27 DIAGNOSIS — I129 Hypertensive chronic kidney disease with stage 1 through stage 4 chronic kidney disease, or unspecified chronic kidney disease: Secondary | ICD-10-CM | POA: Diagnosis not present

## 2020-02-03 DIAGNOSIS — R079 Chest pain, unspecified: Secondary | ICD-10-CM | POA: Diagnosis not present

## 2020-02-03 DIAGNOSIS — M542 Cervicalgia: Secondary | ICD-10-CM | POA: Diagnosis not present

## 2020-02-03 DIAGNOSIS — Z09 Encounter for follow-up examination after completed treatment for conditions other than malignant neoplasm: Secondary | ICD-10-CM | POA: Diagnosis not present

## 2020-02-03 DIAGNOSIS — M3500 Sicca syndrome, unspecified: Secondary | ICD-10-CM | POA: Diagnosis not present

## 2020-02-03 DIAGNOSIS — Z299 Encounter for prophylactic measures, unspecified: Secondary | ICD-10-CM | POA: Diagnosis not present

## 2020-03-03 DIAGNOSIS — M3500 Sicca syndrome, unspecified: Secondary | ICD-10-CM | POA: Diagnosis not present

## 2020-03-03 DIAGNOSIS — I25119 Atherosclerotic heart disease of native coronary artery with unspecified angina pectoris: Secondary | ICD-10-CM | POA: Diagnosis not present

## 2020-03-03 DIAGNOSIS — R21 Rash and other nonspecific skin eruption: Secondary | ICD-10-CM | POA: Diagnosis not present

## 2020-03-03 DIAGNOSIS — Z299 Encounter for prophylactic measures, unspecified: Secondary | ICD-10-CM | POA: Diagnosis not present

## 2020-03-03 DIAGNOSIS — L509 Urticaria, unspecified: Secondary | ICD-10-CM | POA: Diagnosis not present

## 2020-03-07 DIAGNOSIS — I2510 Atherosclerotic heart disease of native coronary artery without angina pectoris: Secondary | ICD-10-CM | POA: Diagnosis not present

## 2020-03-07 DIAGNOSIS — E7800 Pure hypercholesterolemia, unspecified: Secondary | ICD-10-CM | POA: Diagnosis not present

## 2020-03-17 DIAGNOSIS — M545 Low back pain: Secondary | ICD-10-CM | POA: Diagnosis not present

## 2020-03-17 DIAGNOSIS — M5416 Radiculopathy, lumbar region: Secondary | ICD-10-CM | POA: Diagnosis not present

## 2020-03-24 DIAGNOSIS — D045 Carcinoma in situ of skin of trunk: Secondary | ICD-10-CM | POA: Diagnosis not present

## 2020-03-24 DIAGNOSIS — B359 Dermatophytosis, unspecified: Secondary | ICD-10-CM | POA: Diagnosis not present

## 2020-03-24 DIAGNOSIS — B353 Tinea pedis: Secondary | ICD-10-CM | POA: Diagnosis not present

## 2020-03-24 DIAGNOSIS — B356 Tinea cruris: Secondary | ICD-10-CM | POA: Diagnosis not present

## 2020-03-24 DIAGNOSIS — D485 Neoplasm of uncertain behavior of skin: Secondary | ICD-10-CM | POA: Diagnosis not present

## 2020-03-24 DIAGNOSIS — L570 Actinic keratosis: Secondary | ICD-10-CM | POA: Diagnosis not present

## 2020-03-24 DIAGNOSIS — B352 Tinea manuum: Secondary | ICD-10-CM | POA: Diagnosis not present

## 2020-03-24 DIAGNOSIS — L814 Other melanin hyperpigmentation: Secondary | ICD-10-CM | POA: Diagnosis not present

## 2020-03-31 DIAGNOSIS — C44529 Squamous cell carcinoma of skin of other part of trunk: Secondary | ICD-10-CM | POA: Diagnosis not present

## 2020-04-04 DIAGNOSIS — M545 Low back pain: Secondary | ICD-10-CM | POA: Diagnosis not present

## 2020-04-04 DIAGNOSIS — M5416 Radiculopathy, lumbar region: Secondary | ICD-10-CM | POA: Diagnosis not present

## 2020-04-11 DIAGNOSIS — I10 Essential (primary) hypertension: Secondary | ICD-10-CM | POA: Diagnosis not present

## 2020-04-11 DIAGNOSIS — E785 Hyperlipidemia, unspecified: Secondary | ICD-10-CM | POA: Diagnosis not present

## 2020-04-11 DIAGNOSIS — I25118 Atherosclerotic heart disease of native coronary artery with other forms of angina pectoris: Secondary | ICD-10-CM | POA: Diagnosis not present

## 2020-04-13 DIAGNOSIS — L821 Other seborrheic keratosis: Secondary | ICD-10-CM | POA: Diagnosis not present

## 2020-04-13 DIAGNOSIS — L309 Dermatitis, unspecified: Secondary | ICD-10-CM | POA: Diagnosis not present

## 2020-04-13 DIAGNOSIS — D485 Neoplasm of uncertain behavior of skin: Secondary | ICD-10-CM | POA: Diagnosis not present

## 2020-04-13 DIAGNOSIS — C44619 Basal cell carcinoma of skin of left upper limb, including shoulder: Secondary | ICD-10-CM | POA: Diagnosis not present

## 2020-04-13 DIAGNOSIS — L280 Lichen simplex chronicus: Secondary | ICD-10-CM | POA: Diagnosis not present

## 2020-04-17 DIAGNOSIS — E7800 Pure hypercholesterolemia, unspecified: Secondary | ICD-10-CM | POA: Diagnosis not present

## 2020-04-17 DIAGNOSIS — I2510 Atherosclerotic heart disease of native coronary artery without angina pectoris: Secondary | ICD-10-CM | POA: Diagnosis not present

## 2020-04-21 DIAGNOSIS — L988 Other specified disorders of the skin and subcutaneous tissue: Secondary | ICD-10-CM | POA: Diagnosis not present

## 2020-06-17 DIAGNOSIS — I2510 Atherosclerotic heart disease of native coronary artery without angina pectoris: Secondary | ICD-10-CM | POA: Diagnosis not present

## 2020-06-17 DIAGNOSIS — E7800 Pure hypercholesterolemia, unspecified: Secondary | ICD-10-CM | POA: Diagnosis not present

## 2020-06-29 DIAGNOSIS — L290 Pruritus ani: Secondary | ICD-10-CM | POA: Diagnosis not present

## 2020-06-29 DIAGNOSIS — D485 Neoplasm of uncertain behavior of skin: Secondary | ICD-10-CM | POA: Diagnosis not present

## 2020-06-29 DIAGNOSIS — L859 Epidermal thickening, unspecified: Secondary | ICD-10-CM | POA: Diagnosis not present

## 2020-06-29 DIAGNOSIS — L858 Other specified epidermal thickening: Secondary | ICD-10-CM | POA: Diagnosis not present

## 2020-07-06 DIAGNOSIS — E7800 Pure hypercholesterolemia, unspecified: Secondary | ICD-10-CM | POA: Diagnosis not present

## 2020-07-06 DIAGNOSIS — I2510 Atherosclerotic heart disease of native coronary artery without angina pectoris: Secondary | ICD-10-CM | POA: Diagnosis not present

## 2020-07-12 DIAGNOSIS — Z299 Encounter for prophylactic measures, unspecified: Secondary | ICD-10-CM | POA: Diagnosis not present

## 2020-07-12 DIAGNOSIS — R21 Rash and other nonspecific skin eruption: Secondary | ICD-10-CM | POA: Diagnosis not present

## 2020-07-12 DIAGNOSIS — Z6826 Body mass index (BMI) 26.0-26.9, adult: Secondary | ICD-10-CM | POA: Diagnosis not present

## 2020-07-12 DIAGNOSIS — I25110 Atherosclerotic heart disease of native coronary artery with unstable angina pectoris: Secondary | ICD-10-CM | POA: Diagnosis not present

## 2020-07-12 DIAGNOSIS — N1831 Chronic kidney disease, stage 3a: Secondary | ICD-10-CM | POA: Diagnosis not present

## 2020-08-10 DIAGNOSIS — Z1339 Encounter for screening examination for other mental health and behavioral disorders: Secondary | ICD-10-CM | POA: Diagnosis not present

## 2020-08-10 DIAGNOSIS — Z125 Encounter for screening for malignant neoplasm of prostate: Secondary | ICD-10-CM | POA: Diagnosis not present

## 2020-08-10 DIAGNOSIS — Z1331 Encounter for screening for depression: Secondary | ICD-10-CM | POA: Diagnosis not present

## 2020-08-10 DIAGNOSIS — M3500 Sicca syndrome, unspecified: Secondary | ICD-10-CM | POA: Diagnosis not present

## 2020-08-10 DIAGNOSIS — Z299 Encounter for prophylactic measures, unspecified: Secondary | ICD-10-CM | POA: Diagnosis not present

## 2020-08-10 DIAGNOSIS — Z79899 Other long term (current) drug therapy: Secondary | ICD-10-CM | POA: Diagnosis not present

## 2020-08-10 DIAGNOSIS — R5383 Other fatigue: Secondary | ICD-10-CM | POA: Diagnosis not present

## 2020-08-10 DIAGNOSIS — Z0000 Encounter for general adult medical examination without abnormal findings: Secondary | ICD-10-CM | POA: Diagnosis not present

## 2020-08-10 DIAGNOSIS — Z7189 Other specified counseling: Secondary | ICD-10-CM | POA: Diagnosis not present

## 2020-08-10 DIAGNOSIS — E7800 Pure hypercholesterolemia, unspecified: Secondary | ICD-10-CM | POA: Diagnosis not present

## 2020-08-18 DIAGNOSIS — I2510 Atherosclerotic heart disease of native coronary artery without angina pectoris: Secondary | ICD-10-CM | POA: Diagnosis not present

## 2020-08-18 DIAGNOSIS — E7800 Pure hypercholesterolemia, unspecified: Secondary | ICD-10-CM | POA: Diagnosis not present

## 2020-09-01 DIAGNOSIS — Z23 Encounter for immunization: Secondary | ICD-10-CM | POA: Diagnosis not present

## 2020-09-16 DIAGNOSIS — I2510 Atherosclerotic heart disease of native coronary artery without angina pectoris: Secondary | ICD-10-CM | POA: Diagnosis not present

## 2020-09-16 DIAGNOSIS — E7800 Pure hypercholesterolemia, unspecified: Secondary | ICD-10-CM | POA: Diagnosis not present

## 2020-09-17 DIAGNOSIS — R1110 Vomiting, unspecified: Secondary | ICD-10-CM | POA: Diagnosis not present

## 2020-09-17 DIAGNOSIS — R9431 Abnormal electrocardiogram [ECG] [EKG]: Secondary | ICD-10-CM | POA: Diagnosis not present

## 2020-09-17 DIAGNOSIS — K859 Acute pancreatitis without necrosis or infection, unspecified: Secondary | ICD-10-CM | POA: Diagnosis not present

## 2020-09-17 DIAGNOSIS — D72829 Elevated white blood cell count, unspecified: Secondary | ICD-10-CM | POA: Diagnosis not present

## 2020-09-17 DIAGNOSIS — I700 Atherosclerosis of aorta: Secondary | ICD-10-CM | POA: Diagnosis not present

## 2020-09-17 DIAGNOSIS — R000 Tachycardia, unspecified: Secondary | ICD-10-CM | POA: Diagnosis not present

## 2020-09-17 DIAGNOSIS — Z20822 Contact with and (suspected) exposure to covid-19: Secondary | ICD-10-CM | POA: Diagnosis not present

## 2020-09-17 DIAGNOSIS — K8689 Other specified diseases of pancreas: Secondary | ICD-10-CM | POA: Diagnosis not present

## 2020-09-17 DIAGNOSIS — R1013 Epigastric pain: Secondary | ICD-10-CM | POA: Diagnosis not present

## 2020-09-17 DIAGNOSIS — Z049 Encounter for examination and observation for unspecified reason: Secondary | ICD-10-CM | POA: Diagnosis not present

## 2020-09-17 DIAGNOSIS — Z951 Presence of aortocoronary bypass graft: Secondary | ICD-10-CM | POA: Diagnosis not present

## 2020-09-17 DIAGNOSIS — K828 Other specified diseases of gallbladder: Secondary | ICD-10-CM | POA: Diagnosis not present

## 2020-09-17 DIAGNOSIS — R599 Enlarged lymph nodes, unspecified: Secondary | ICD-10-CM | POA: Diagnosis not present

## 2020-09-17 DIAGNOSIS — R079 Chest pain, unspecified: Secondary | ICD-10-CM | POA: Diagnosis not present

## 2020-09-17 DIAGNOSIS — R1084 Generalized abdominal pain: Secondary | ICD-10-CM | POA: Diagnosis not present

## 2020-09-19 DIAGNOSIS — E785 Hyperlipidemia, unspecified: Secondary | ICD-10-CM | POA: Diagnosis not present

## 2020-09-19 DIAGNOSIS — K5730 Diverticulosis of large intestine without perforation or abscess without bleeding: Secondary | ICD-10-CM | POA: Diagnosis not present

## 2020-09-19 DIAGNOSIS — N281 Cyst of kidney, acquired: Secondary | ICD-10-CM | POA: Diagnosis not present

## 2020-09-19 DIAGNOSIS — D692 Other nonthrombocytopenic purpura: Secondary | ICD-10-CM | POA: Diagnosis not present

## 2020-09-19 DIAGNOSIS — I10 Essential (primary) hypertension: Secondary | ICD-10-CM | POA: Diagnosis not present

## 2020-09-19 DIAGNOSIS — R1013 Epigastric pain: Secondary | ICD-10-CM | POA: Diagnosis not present

## 2020-09-19 DIAGNOSIS — J8410 Pulmonary fibrosis, unspecified: Secondary | ICD-10-CM | POA: Diagnosis not present

## 2020-09-19 DIAGNOSIS — K831 Obstruction of bile duct: Secondary | ICD-10-CM | POA: Diagnosis not present

## 2020-09-19 DIAGNOSIS — R109 Unspecified abdominal pain: Secondary | ICD-10-CM | POA: Diagnosis not present

## 2020-09-19 DIAGNOSIS — R590 Localized enlarged lymph nodes: Secondary | ICD-10-CM | POA: Diagnosis not present

## 2020-09-19 DIAGNOSIS — R110 Nausea: Secondary | ICD-10-CM | POA: Diagnosis not present

## 2020-09-19 DIAGNOSIS — I2510 Atherosclerotic heart disease of native coronary artery without angina pectoris: Secondary | ICD-10-CM | POA: Diagnosis not present

## 2020-09-19 DIAGNOSIS — R7401 Elevation of levels of liver transaminase levels: Secondary | ICD-10-CM | POA: Diagnosis not present

## 2020-09-19 DIAGNOSIS — J984 Other disorders of lung: Secondary | ICD-10-CM | POA: Diagnosis not present

## 2020-09-19 DIAGNOSIS — Z299 Encounter for prophylactic measures, unspecified: Secondary | ICD-10-CM | POA: Diagnosis not present

## 2020-09-19 DIAGNOSIS — Z951 Presence of aortocoronary bypass graft: Secondary | ICD-10-CM | POA: Diagnosis not present

## 2020-09-19 DIAGNOSIS — K859 Acute pancreatitis without necrosis or infection, unspecified: Secondary | ICD-10-CM | POA: Diagnosis not present

## 2020-09-19 DIAGNOSIS — C228 Malignant neoplasm of liver, primary, unspecified as to type: Secondary | ICD-10-CM | POA: Diagnosis not present

## 2020-09-19 DIAGNOSIS — C189 Malignant neoplasm of colon, unspecified: Secondary | ICD-10-CM | POA: Diagnosis not present

## 2020-09-19 DIAGNOSIS — M3500 Sicca syndrome, unspecified: Secondary | ICD-10-CM | POA: Diagnosis not present

## 2020-09-19 DIAGNOSIS — R630 Anorexia: Secondary | ICD-10-CM | POA: Diagnosis not present

## 2020-09-19 DIAGNOSIS — K769 Liver disease, unspecified: Secondary | ICD-10-CM | POA: Diagnosis not present

## 2020-09-19 DIAGNOSIS — K8689 Other specified diseases of pancreas: Secondary | ICD-10-CM | POA: Diagnosis not present

## 2020-09-20 DIAGNOSIS — R1013 Epigastric pain: Secondary | ICD-10-CM | POA: Diagnosis not present

## 2020-09-20 DIAGNOSIS — R7401 Elevation of levels of liver transaminase levels: Secondary | ICD-10-CM | POA: Diagnosis not present

## 2020-09-20 DIAGNOSIS — R160 Hepatomegaly, not elsewhere classified: Secondary | ICD-10-CM | POA: Diagnosis not present

## 2020-09-20 DIAGNOSIS — I2510 Atherosclerotic heart disease of native coronary artery without angina pectoris: Secondary | ICD-10-CM | POA: Diagnosis not present

## 2020-09-20 DIAGNOSIS — D490 Neoplasm of unspecified behavior of digestive system: Secondary | ICD-10-CM | POA: Diagnosis not present

## 2020-09-20 DIAGNOSIS — R590 Localized enlarged lymph nodes: Secondary | ICD-10-CM | POA: Diagnosis not present

## 2020-09-20 DIAGNOSIS — J984 Other disorders of lung: Secondary | ICD-10-CM | POA: Diagnosis not present

## 2020-09-20 DIAGNOSIS — N281 Cyst of kidney, acquired: Secondary | ICD-10-CM | POA: Diagnosis not present

## 2020-09-20 DIAGNOSIS — R110 Nausea: Secondary | ICD-10-CM | POA: Diagnosis not present

## 2020-09-20 DIAGNOSIS — Z951 Presence of aortocoronary bypass graft: Secondary | ICD-10-CM | POA: Diagnosis not present

## 2020-09-20 DIAGNOSIS — C189 Malignant neoplasm of colon, unspecified: Secondary | ICD-10-CM | POA: Diagnosis not present

## 2020-09-20 DIAGNOSIS — K8689 Other specified diseases of pancreas: Secondary | ICD-10-CM | POA: Diagnosis not present

## 2020-09-20 DIAGNOSIS — K769 Liver disease, unspecified: Secondary | ICD-10-CM | POA: Diagnosis not present

## 2020-09-20 DIAGNOSIS — E785 Hyperlipidemia, unspecified: Secondary | ICD-10-CM | POA: Diagnosis not present

## 2020-09-20 DIAGNOSIS — I10 Essential (primary) hypertension: Secondary | ICD-10-CM | POA: Diagnosis not present

## 2020-09-20 DIAGNOSIS — R630 Anorexia: Secondary | ICD-10-CM | POA: Diagnosis not present

## 2020-09-20 DIAGNOSIS — K5730 Diverticulosis of large intestine without perforation or abscess without bleeding: Secondary | ICD-10-CM | POA: Diagnosis not present

## 2020-09-21 DIAGNOSIS — C229 Malignant neoplasm of liver, not specified as primary or secondary: Secondary | ICD-10-CM | POA: Diagnosis not present

## 2020-09-21 DIAGNOSIS — K769 Liver disease, unspecified: Secondary | ICD-10-CM | POA: Diagnosis not present

## 2020-09-21 DIAGNOSIS — D490 Neoplasm of unspecified behavior of digestive system: Secondary | ICD-10-CM | POA: Diagnosis not present

## 2020-09-21 DIAGNOSIS — R590 Localized enlarged lymph nodes: Secondary | ICD-10-CM | POA: Diagnosis not present

## 2020-09-21 DIAGNOSIS — C189 Malignant neoplasm of colon, unspecified: Secondary | ICD-10-CM | POA: Diagnosis not present

## 2020-09-21 DIAGNOSIS — J8410 Pulmonary fibrosis, unspecified: Secondary | ICD-10-CM | POA: Diagnosis not present

## 2020-09-21 DIAGNOSIS — C228 Malignant neoplasm of liver, primary, unspecified as to type: Secondary | ICD-10-CM | POA: Diagnosis present

## 2020-09-21 DIAGNOSIS — I2510 Atherosclerotic heart disease of native coronary artery without angina pectoris: Secondary | ICD-10-CM | POA: Diagnosis not present

## 2020-09-21 DIAGNOSIS — J984 Other disorders of lung: Secondary | ICD-10-CM | POA: Diagnosis not present

## 2020-09-21 DIAGNOSIS — L298 Other pruritus: Secondary | ICD-10-CM | POA: Diagnosis present

## 2020-09-21 DIAGNOSIS — Z951 Presence of aortocoronary bypass graft: Secondary | ICD-10-CM | POA: Diagnosis not present

## 2020-09-21 DIAGNOSIS — K831 Obstruction of bile duct: Secondary | ICD-10-CM | POA: Diagnosis present

## 2020-09-21 DIAGNOSIS — R109 Unspecified abdominal pain: Secondary | ICD-10-CM | POA: Diagnosis not present

## 2020-09-21 DIAGNOSIS — R7401 Elevation of levels of liver transaminase levels: Secondary | ICD-10-CM | POA: Diagnosis not present

## 2020-09-21 DIAGNOSIS — C801 Malignant (primary) neoplasm, unspecified: Secondary | ICD-10-CM | POA: Diagnosis not present

## 2020-09-22 DIAGNOSIS — D490 Neoplasm of unspecified behavior of digestive system: Secondary | ICD-10-CM | POA: Diagnosis not present

## 2020-09-22 DIAGNOSIS — J8410 Pulmonary fibrosis, unspecified: Secondary | ICD-10-CM | POA: Diagnosis not present

## 2020-09-22 DIAGNOSIS — R7401 Elevation of levels of liver transaminase levels: Secondary | ICD-10-CM | POA: Diagnosis not present

## 2020-09-22 DIAGNOSIS — I2510 Atherosclerotic heart disease of native coronary artery without angina pectoris: Secondary | ICD-10-CM | POA: Diagnosis not present

## 2020-09-22 DIAGNOSIS — Z951 Presence of aortocoronary bypass graft: Secondary | ICD-10-CM | POA: Diagnosis not present

## 2020-09-28 DIAGNOSIS — K5900 Constipation, unspecified: Secondary | ICD-10-CM | POA: Diagnosis not present

## 2020-09-28 DIAGNOSIS — C7951 Secondary malignant neoplasm of bone: Secondary | ICD-10-CM | POA: Diagnosis not present

## 2020-09-28 DIAGNOSIS — C787 Secondary malignant neoplasm of liver and intrahepatic bile duct: Secondary | ICD-10-CM | POA: Diagnosis not present

## 2020-09-28 DIAGNOSIS — E079 Disorder of thyroid, unspecified: Secondary | ICD-10-CM | POA: Diagnosis not present

## 2020-09-28 DIAGNOSIS — C801 Malignant (primary) neoplasm, unspecified: Secondary | ICD-10-CM | POA: Diagnosis not present

## 2020-10-04 DIAGNOSIS — C188 Malignant neoplasm of overlapping sites of colon: Secondary | ICD-10-CM | POA: Diagnosis not present

## 2020-10-04 DIAGNOSIS — C7989 Secondary malignant neoplasm of other specified sites: Secondary | ICD-10-CM | POA: Diagnosis not present

## 2020-10-04 DIAGNOSIS — K7689 Other specified diseases of liver: Secondary | ICD-10-CM | POA: Diagnosis not present

## 2020-10-04 DIAGNOSIS — C781 Secondary malignant neoplasm of mediastinum: Secondary | ICD-10-CM | POA: Diagnosis not present

## 2020-10-04 DIAGNOSIS — N281 Cyst of kidney, acquired: Secondary | ICD-10-CM | POA: Diagnosis not present

## 2020-10-04 DIAGNOSIS — C787 Secondary malignant neoplasm of liver and intrahepatic bile duct: Secondary | ICD-10-CM | POA: Diagnosis not present

## 2020-10-04 DIAGNOSIS — K5730 Diverticulosis of large intestine without perforation or abscess without bleeding: Secondary | ICD-10-CM | POA: Diagnosis not present

## 2020-10-04 DIAGNOSIS — C801 Malignant (primary) neoplasm, unspecified: Secondary | ICD-10-CM | POA: Diagnosis not present

## 2020-10-11 DIAGNOSIS — E079 Disorder of thyroid, unspecified: Secondary | ICD-10-CM | POA: Diagnosis not present

## 2020-10-11 DIAGNOSIS — C7951 Secondary malignant neoplasm of bone: Secondary | ICD-10-CM | POA: Diagnosis not present

## 2020-10-11 DIAGNOSIS — C801 Malignant (primary) neoplasm, unspecified: Secondary | ICD-10-CM | POA: Diagnosis not present

## 2020-10-11 DIAGNOSIS — K5900 Constipation, unspecified: Secondary | ICD-10-CM | POA: Diagnosis not present

## 2020-10-11 DIAGNOSIS — C787 Secondary malignant neoplasm of liver and intrahepatic bile duct: Secondary | ICD-10-CM | POA: Diagnosis not present

## 2020-10-25 DIAGNOSIS — K859 Acute pancreatitis without necrosis or infection, unspecified: Secondary | ICD-10-CM | POA: Diagnosis not present

## 2020-10-25 DIAGNOSIS — N281 Cyst of kidney, acquired: Secondary | ICD-10-CM | POA: Diagnosis not present

## 2020-10-25 DIAGNOSIS — R079 Chest pain, unspecified: Secondary | ICD-10-CM | POA: Diagnosis not present

## 2020-10-25 DIAGNOSIS — R531 Weakness: Secondary | ICD-10-CM | POA: Diagnosis not present

## 2020-10-25 DIAGNOSIS — C787 Secondary malignant neoplasm of liver and intrahepatic bile duct: Secondary | ICD-10-CM | POA: Diagnosis not present

## 2020-10-25 DIAGNOSIS — E785 Hyperlipidemia, unspecified: Secondary | ICD-10-CM | POA: Diagnosis not present

## 2020-10-25 DIAGNOSIS — I2510 Atherosclerotic heart disease of native coronary artery without angina pectoris: Secondary | ICD-10-CM | POA: Diagnosis not present

## 2020-10-25 DIAGNOSIS — K8689 Other specified diseases of pancreas: Secondary | ICD-10-CM | POA: Diagnosis not present

## 2020-10-25 DIAGNOSIS — R112 Nausea with vomiting, unspecified: Secondary | ICD-10-CM | POA: Diagnosis not present

## 2020-10-25 DIAGNOSIS — Z049 Encounter for examination and observation for unspecified reason: Secondary | ICD-10-CM | POA: Diagnosis not present

## 2020-10-25 DIAGNOSIS — K851 Biliary acute pancreatitis without necrosis or infection: Secondary | ICD-10-CM | POA: Diagnosis not present

## 2020-10-25 DIAGNOSIS — I77811 Abdominal aortic ectasia: Secondary | ICD-10-CM | POA: Diagnosis not present

## 2020-10-25 DIAGNOSIS — R590 Localized enlarged lymph nodes: Secondary | ICD-10-CM | POA: Diagnosis not present

## 2020-10-25 DIAGNOSIS — I10 Essential (primary) hypertension: Secondary | ICD-10-CM | POA: Diagnosis not present

## 2020-10-25 DIAGNOSIS — R0789 Other chest pain: Secondary | ICD-10-CM | POA: Diagnosis not present

## 2020-10-25 DIAGNOSIS — E860 Dehydration: Secondary | ICD-10-CM | POA: Diagnosis not present

## 2020-10-25 DIAGNOSIS — C801 Malignant (primary) neoplasm, unspecified: Secondary | ICD-10-CM | POA: Diagnosis not present

## 2020-10-26 DIAGNOSIS — K7689 Other specified diseases of liver: Secondary | ICD-10-CM | POA: Diagnosis not present

## 2020-10-26 DIAGNOSIS — Z79899 Other long term (current) drug therapy: Secondary | ICD-10-CM | POA: Diagnosis not present

## 2020-10-26 DIAGNOSIS — Z87891 Personal history of nicotine dependence: Secondary | ICD-10-CM | POA: Diagnosis not present

## 2020-10-26 DIAGNOSIS — C787 Secondary malignant neoplasm of liver and intrahepatic bile duct: Secondary | ICD-10-CM | POA: Diagnosis present

## 2020-10-26 DIAGNOSIS — K769 Liver disease, unspecified: Secondary | ICD-10-CM | POA: Diagnosis not present

## 2020-10-26 DIAGNOSIS — I10 Essential (primary) hypertension: Secondary | ICD-10-CM | POA: Diagnosis not present

## 2020-10-26 DIAGNOSIS — Z7982 Long term (current) use of aspirin: Secondary | ICD-10-CM | POA: Diagnosis not present

## 2020-10-26 DIAGNOSIS — K8050 Calculus of bile duct without cholangitis or cholecystitis without obstruction: Secondary | ICD-10-CM | POA: Diagnosis not present

## 2020-10-26 DIAGNOSIS — E782 Mixed hyperlipidemia: Secondary | ICD-10-CM | POA: Diagnosis not present

## 2020-10-26 DIAGNOSIS — I252 Old myocardial infarction: Secondary | ICD-10-CM | POA: Diagnosis not present

## 2020-10-26 DIAGNOSIS — K859 Acute pancreatitis without necrosis or infection, unspecified: Secondary | ICD-10-CM | POA: Diagnosis not present

## 2020-10-26 DIAGNOSIS — Z20822 Contact with and (suspected) exposure to covid-19: Secondary | ICD-10-CM | POA: Diagnosis present

## 2020-10-26 DIAGNOSIS — K838 Other specified diseases of biliary tract: Secondary | ICD-10-CM | POA: Diagnosis not present

## 2020-10-26 DIAGNOSIS — I2510 Atherosclerotic heart disease of native coronary artery without angina pectoris: Secondary | ICD-10-CM | POA: Diagnosis present

## 2020-10-26 DIAGNOSIS — E785 Hyperlipidemia, unspecified: Secondary | ICD-10-CM | POA: Diagnosis present

## 2020-10-26 DIAGNOSIS — K851 Biliary acute pancreatitis without necrosis or infection: Secondary | ICD-10-CM | POA: Diagnosis not present

## 2020-10-26 DIAGNOSIS — I25810 Atherosclerosis of coronary artery bypass graft(s) without angina pectoris: Secondary | ICD-10-CM | POA: Diagnosis not present

## 2020-10-26 DIAGNOSIS — K828 Other specified diseases of gallbladder: Secondary | ICD-10-CM | POA: Diagnosis not present

## 2020-10-26 DIAGNOSIS — K8500 Idiopathic acute pancreatitis without necrosis or infection: Secondary | ICD-10-CM | POA: Diagnosis not present

## 2020-10-26 DIAGNOSIS — K8689 Other specified diseases of pancreas: Secondary | ICD-10-CM | POA: Diagnosis not present

## 2020-10-26 DIAGNOSIS — R0789 Other chest pain: Secondary | ICD-10-CM | POA: Diagnosis not present

## 2020-10-26 DIAGNOSIS — Z9889 Other specified postprocedural states: Secondary | ICD-10-CM | POA: Diagnosis not present

## 2020-10-26 DIAGNOSIS — C801 Malignant (primary) neoplasm, unspecified: Secondary | ICD-10-CM | POA: Diagnosis not present

## 2020-10-26 DIAGNOSIS — R531 Weakness: Secondary | ICD-10-CM | POA: Diagnosis present

## 2020-10-26 DIAGNOSIS — Z951 Presence of aortocoronary bypass graft: Secondary | ICD-10-CM | POA: Diagnosis not present

## 2020-10-26 DIAGNOSIS — K8020 Calculus of gallbladder without cholecystitis without obstruction: Secondary | ICD-10-CM | POA: Diagnosis not present

## 2020-10-27 DIAGNOSIS — K859 Acute pancreatitis without necrosis or infection, unspecified: Secondary | ICD-10-CM | POA: Diagnosis not present

## 2020-10-27 DIAGNOSIS — K8050 Calculus of bile duct without cholangitis or cholecystitis without obstruction: Secondary | ICD-10-CM | POA: Diagnosis not present

## 2020-10-27 DIAGNOSIS — K851 Biliary acute pancreatitis without necrosis or infection: Secondary | ICD-10-CM | POA: Diagnosis not present

## 2020-11-01 DIAGNOSIS — K8050 Calculus of bile duct without cholangitis or cholecystitis without obstruction: Secondary | ICD-10-CM | POA: Diagnosis not present

## 2020-11-01 DIAGNOSIS — Z87891 Personal history of nicotine dependence: Secondary | ICD-10-CM | POA: Diagnosis not present

## 2020-11-01 DIAGNOSIS — K8500 Idiopathic acute pancreatitis without necrosis or infection: Secondary | ICD-10-CM | POA: Diagnosis not present

## 2020-11-01 DIAGNOSIS — Z951 Presence of aortocoronary bypass graft: Secondary | ICD-10-CM | POA: Diagnosis not present

## 2020-11-01 DIAGNOSIS — Z299 Encounter for prophylactic measures, unspecified: Secondary | ICD-10-CM | POA: Diagnosis not present

## 2020-11-01 DIAGNOSIS — Z9181 History of falling: Secondary | ICD-10-CM | POA: Diagnosis not present

## 2020-11-01 DIAGNOSIS — C801 Malignant (primary) neoplasm, unspecified: Secondary | ICD-10-CM | POA: Diagnosis not present

## 2020-11-01 DIAGNOSIS — E782 Mixed hyperlipidemia: Secondary | ICD-10-CM | POA: Diagnosis not present

## 2020-11-01 DIAGNOSIS — C799 Secondary malignant neoplasm of unspecified site: Secondary | ICD-10-CM | POA: Diagnosis not present

## 2020-11-01 DIAGNOSIS — I25119 Atherosclerotic heart disease of native coronary artery with unspecified angina pectoris: Secondary | ICD-10-CM | POA: Diagnosis not present

## 2020-11-01 DIAGNOSIS — C787 Secondary malignant neoplasm of liver and intrahepatic bile duct: Secondary | ICD-10-CM | POA: Diagnosis not present

## 2020-11-01 DIAGNOSIS — I10 Essential (primary) hypertension: Secondary | ICD-10-CM | POA: Diagnosis not present

## 2020-11-01 DIAGNOSIS — M3500 Sicca syndrome, unspecified: Secondary | ICD-10-CM | POA: Diagnosis not present

## 2020-11-01 DIAGNOSIS — I700 Atherosclerosis of aorta: Secondary | ICD-10-CM | POA: Diagnosis not present

## 2020-11-01 DIAGNOSIS — E079 Disorder of thyroid, unspecified: Secondary | ICD-10-CM | POA: Diagnosis not present

## 2020-11-01 DIAGNOSIS — I2510 Atherosclerotic heart disease of native coronary artery without angina pectoris: Secondary | ICD-10-CM | POA: Diagnosis not present

## 2020-11-02 DIAGNOSIS — I10 Essential (primary) hypertension: Secondary | ICD-10-CM | POA: Diagnosis not present

## 2020-11-02 DIAGNOSIS — N3289 Other specified disorders of bladder: Secondary | ICD-10-CM | POA: Diagnosis not present

## 2020-11-02 DIAGNOSIS — K8500 Idiopathic acute pancreatitis without necrosis or infection: Secondary | ICD-10-CM | POA: Diagnosis not present

## 2020-11-02 DIAGNOSIS — K5730 Diverticulosis of large intestine without perforation or abscess without bleeding: Secondary | ICD-10-CM | POA: Diagnosis not present

## 2020-11-02 DIAGNOSIS — K8050 Calculus of bile duct without cholangitis or cholecystitis without obstruction: Secondary | ICD-10-CM | POA: Diagnosis not present

## 2020-11-02 DIAGNOSIS — C7951 Secondary malignant neoplasm of bone: Secondary | ICD-10-CM | POA: Diagnosis not present

## 2020-11-02 DIAGNOSIS — C249 Malignant neoplasm of biliary tract, unspecified: Secondary | ICD-10-CM | POA: Diagnosis not present

## 2020-11-02 DIAGNOSIS — K5792 Diverticulitis of intestine, part unspecified, without perforation or abscess without bleeding: Secondary | ICD-10-CM | POA: Diagnosis not present

## 2020-11-02 DIAGNOSIS — Z66 Do not resuscitate: Secondary | ICD-10-CM | POA: Diagnosis not present

## 2020-11-02 DIAGNOSIS — R918 Other nonspecific abnormal finding of lung field: Secondary | ICD-10-CM | POA: Diagnosis not present

## 2020-11-02 DIAGNOSIS — I2510 Atherosclerotic heart disease of native coronary artery without angina pectoris: Secondary | ICD-10-CM | POA: Diagnosis not present

## 2020-11-02 DIAGNOSIS — J8410 Pulmonary fibrosis, unspecified: Secondary | ICD-10-CM | POA: Diagnosis not present

## 2020-11-02 DIAGNOSIS — K8010 Calculus of gallbladder with chronic cholecystitis without obstruction: Secondary | ICD-10-CM | POA: Diagnosis not present

## 2020-11-02 DIAGNOSIS — C787 Secondary malignant neoplasm of liver and intrahepatic bile duct: Secondary | ICD-10-CM | POA: Diagnosis not present

## 2020-11-02 DIAGNOSIS — C801 Malignant (primary) neoplasm, unspecified: Secondary | ICD-10-CM | POA: Diagnosis not present

## 2020-11-02 DIAGNOSIS — K5732 Diverticulitis of large intestine without perforation or abscess without bleeding: Secondary | ICD-10-CM | POA: Diagnosis not present

## 2020-11-02 DIAGNOSIS — R1084 Generalized abdominal pain: Secondary | ICD-10-CM | POA: Diagnosis not present

## 2020-11-02 DIAGNOSIS — K7689 Other specified diseases of liver: Secondary | ICD-10-CM | POA: Diagnosis not present

## 2020-11-03 DIAGNOSIS — C801 Malignant (primary) neoplasm, unspecified: Secondary | ICD-10-CM | POA: Diagnosis not present

## 2020-11-03 DIAGNOSIS — I2510 Atherosclerotic heart disease of native coronary artery without angina pectoris: Secondary | ICD-10-CM | POA: Diagnosis not present

## 2020-11-03 DIAGNOSIS — I10 Essential (primary) hypertension: Secondary | ICD-10-CM | POA: Diagnosis not present

## 2020-11-03 DIAGNOSIS — C787 Secondary malignant neoplasm of liver and intrahepatic bile duct: Secondary | ICD-10-CM | POA: Diagnosis not present

## 2020-11-03 DIAGNOSIS — K8050 Calculus of bile duct without cholangitis or cholecystitis without obstruction: Secondary | ICD-10-CM | POA: Diagnosis not present

## 2020-11-03 DIAGNOSIS — K8500 Idiopathic acute pancreatitis without necrosis or infection: Secondary | ICD-10-CM | POA: Diagnosis not present

## 2020-11-04 DIAGNOSIS — R1084 Generalized abdominal pain: Secondary | ICD-10-CM | POA: Diagnosis not present

## 2020-11-04 DIAGNOSIS — N3289 Other specified disorders of bladder: Secondary | ICD-10-CM | POA: Diagnosis not present

## 2020-11-04 DIAGNOSIS — R918 Other nonspecific abnormal finding of lung field: Secondary | ICD-10-CM | POA: Diagnosis not present

## 2020-11-04 DIAGNOSIS — J8410 Pulmonary fibrosis, unspecified: Secondary | ICD-10-CM | POA: Diagnosis not present

## 2020-11-04 DIAGNOSIS — K5730 Diverticulosis of large intestine without perforation or abscess without bleeding: Secondary | ICD-10-CM | POA: Diagnosis not present

## 2020-11-04 DIAGNOSIS — K851 Biliary acute pancreatitis without necrosis or infection: Secondary | ICD-10-CM | POA: Diagnosis not present

## 2020-11-04 DIAGNOSIS — K7689 Other specified diseases of liver: Secondary | ICD-10-CM | POA: Diagnosis not present

## 2020-11-04 DIAGNOSIS — C787 Secondary malignant neoplasm of liver and intrahepatic bile duct: Secondary | ICD-10-CM | POA: Diagnosis not present

## 2020-11-04 DIAGNOSIS — C801 Malignant (primary) neoplasm, unspecified: Secondary | ICD-10-CM | POA: Diagnosis not present

## 2020-11-04 DIAGNOSIS — K5732 Diverticulitis of large intestine without perforation or abscess without bleeding: Secondary | ICD-10-CM | POA: Diagnosis not present

## 2020-11-05 DIAGNOSIS — Z515 Encounter for palliative care: Secondary | ICD-10-CM | POA: Diagnosis not present

## 2020-11-05 DIAGNOSIS — C801 Malignant (primary) neoplasm, unspecified: Secondary | ICD-10-CM | POA: Diagnosis present

## 2020-11-05 DIAGNOSIS — C228 Malignant neoplasm of liver, primary, unspecified as to type: Secondary | ICD-10-CM | POA: Diagnosis not present

## 2020-11-05 DIAGNOSIS — R1032 Left lower quadrant pain: Secondary | ICD-10-CM | POA: Diagnosis not present

## 2020-11-05 DIAGNOSIS — E785 Hyperlipidemia, unspecified: Secondary | ICD-10-CM | POA: Diagnosis present

## 2020-11-05 DIAGNOSIS — K8010 Calculus of gallbladder with chronic cholecystitis without obstruction: Secondary | ICD-10-CM | POA: Diagnosis present

## 2020-11-05 DIAGNOSIS — I2510 Atherosclerotic heart disease of native coronary artery without angina pectoris: Secondary | ICD-10-CM | POA: Diagnosis present

## 2020-11-05 DIAGNOSIS — G893 Neoplasm related pain (acute) (chronic): Secondary | ICD-10-CM | POA: Diagnosis not present

## 2020-11-05 DIAGNOSIS — I959 Hypotension, unspecified: Secondary | ICD-10-CM | POA: Diagnosis present

## 2020-11-05 DIAGNOSIS — R112 Nausea with vomiting, unspecified: Secondary | ICD-10-CM | POA: Diagnosis not present

## 2020-11-05 DIAGNOSIS — Z951 Presence of aortocoronary bypass graft: Secondary | ICD-10-CM | POA: Diagnosis not present

## 2020-11-05 DIAGNOSIS — K5900 Constipation, unspecified: Secondary | ICD-10-CM | POA: Diagnosis not present

## 2020-11-05 DIAGNOSIS — N400 Enlarged prostate without lower urinary tract symptoms: Secondary | ICD-10-CM | POA: Diagnosis not present

## 2020-11-05 DIAGNOSIS — R945 Abnormal results of liver function studies: Secondary | ICD-10-CM | POA: Diagnosis not present

## 2020-11-05 DIAGNOSIS — Z7189 Other specified counseling: Secondary | ICD-10-CM | POA: Diagnosis not present

## 2020-11-05 DIAGNOSIS — R590 Localized enlarged lymph nodes: Secondary | ICD-10-CM | POA: Diagnosis present

## 2020-11-05 DIAGNOSIS — C249 Malignant neoplasm of biliary tract, unspecified: Secondary | ICD-10-CM | POA: Diagnosis present

## 2020-11-05 DIAGNOSIS — R638 Other symptoms and signs concerning food and fluid intake: Secondary | ICD-10-CM | POA: Diagnosis not present

## 2020-11-05 DIAGNOSIS — I10 Essential (primary) hypertension: Secondary | ICD-10-CM | POA: Diagnosis present

## 2020-11-05 DIAGNOSIS — C7951 Secondary malignant neoplasm of bone: Secondary | ICD-10-CM | POA: Diagnosis present

## 2020-11-05 DIAGNOSIS — C799 Secondary malignant neoplasm of unspecified site: Secondary | ICD-10-CM | POA: Diagnosis not present

## 2020-11-05 DIAGNOSIS — Z20822 Contact with and (suspected) exposure to covid-19: Secondary | ICD-10-CM | POA: Diagnosis present

## 2020-11-05 DIAGNOSIS — E876 Hypokalemia: Secondary | ICD-10-CM | POA: Diagnosis present

## 2020-11-05 DIAGNOSIS — K8050 Calculus of bile duct without cholangitis or cholecystitis without obstruction: Secondary | ICD-10-CM | POA: Diagnosis not present

## 2020-11-05 DIAGNOSIS — Z66 Do not resuscitate: Secondary | ICD-10-CM | POA: Diagnosis present

## 2020-11-05 DIAGNOSIS — R52 Pain, unspecified: Secondary | ICD-10-CM | POA: Diagnosis not present

## 2020-11-05 DIAGNOSIS — R1084 Generalized abdominal pain: Secondary | ICD-10-CM | POA: Diagnosis not present

## 2020-11-05 DIAGNOSIS — R531 Weakness: Secondary | ICD-10-CM | POA: Diagnosis not present

## 2020-11-05 DIAGNOSIS — Z87891 Personal history of nicotine dependence: Secondary | ICD-10-CM | POA: Diagnosis not present

## 2020-11-05 DIAGNOSIS — Z9181 History of falling: Secondary | ICD-10-CM | POA: Diagnosis not present

## 2020-11-05 DIAGNOSIS — K7689 Other specified diseases of liver: Secondary | ICD-10-CM | POA: Diagnosis present

## 2020-11-05 DIAGNOSIS — C787 Secondary malignant neoplasm of liver and intrahepatic bile duct: Secondary | ICD-10-CM | POA: Diagnosis present

## 2020-11-05 DIAGNOSIS — B029 Zoster without complications: Secondary | ICD-10-CM | POA: Diagnosis present

## 2020-11-05 DIAGNOSIS — Z7982 Long term (current) use of aspirin: Secondary | ICD-10-CM | POA: Diagnosis not present

## 2020-11-05 DIAGNOSIS — K219 Gastro-esophageal reflux disease without esophagitis: Secondary | ICD-10-CM | POA: Diagnosis present

## 2020-11-05 DIAGNOSIS — Z7901 Long term (current) use of anticoagulants: Secondary | ICD-10-CM | POA: Diagnosis not present

## 2020-11-05 DIAGNOSIS — Z8719 Personal history of other diseases of the digestive system: Secondary | ICD-10-CM | POA: Diagnosis not present

## 2020-11-05 DIAGNOSIS — K8500 Idiopathic acute pancreatitis without necrosis or infection: Secondary | ICD-10-CM | POA: Diagnosis not present

## 2020-11-05 DIAGNOSIS — K5792 Diverticulitis of intestine, part unspecified, without perforation or abscess without bleeding: Secondary | ICD-10-CM | POA: Diagnosis present

## 2020-11-05 DIAGNOSIS — G609 Hereditary and idiopathic neuropathy, unspecified: Secondary | ICD-10-CM | POA: Diagnosis not present

## 2020-11-05 DIAGNOSIS — Z79899 Other long term (current) drug therapy: Secondary | ICD-10-CM | POA: Diagnosis not present

## 2020-11-10 DIAGNOSIS — C801 Malignant (primary) neoplasm, unspecified: Secondary | ICD-10-CM | POA: Diagnosis not present

## 2020-11-10 DIAGNOSIS — C787 Secondary malignant neoplasm of liver and intrahepatic bile duct: Secondary | ICD-10-CM | POA: Diagnosis not present

## 2020-11-10 DIAGNOSIS — K8500 Idiopathic acute pancreatitis without necrosis or infection: Secondary | ICD-10-CM | POA: Diagnosis not present

## 2020-11-10 DIAGNOSIS — K8050 Calculus of bile duct without cholangitis or cholecystitis without obstruction: Secondary | ICD-10-CM | POA: Diagnosis not present

## 2020-12-07 ENCOUNTER — Other Ambulatory Visit: Payer: Self-pay | Admitting: Cardiology

## 2020-12-20 DEATH — deceased

## 2021-02-02 IMAGING — DX DG CHEST 2V
2 series · 2 of 2 positions shown · non-contrast
Comparison: Radiographs 12/17/2016 and 01/06/2015.

CLINICAL DATA: Mid chest pain.

EXAM:
CHEST - 2 VIEW

[chest pa]
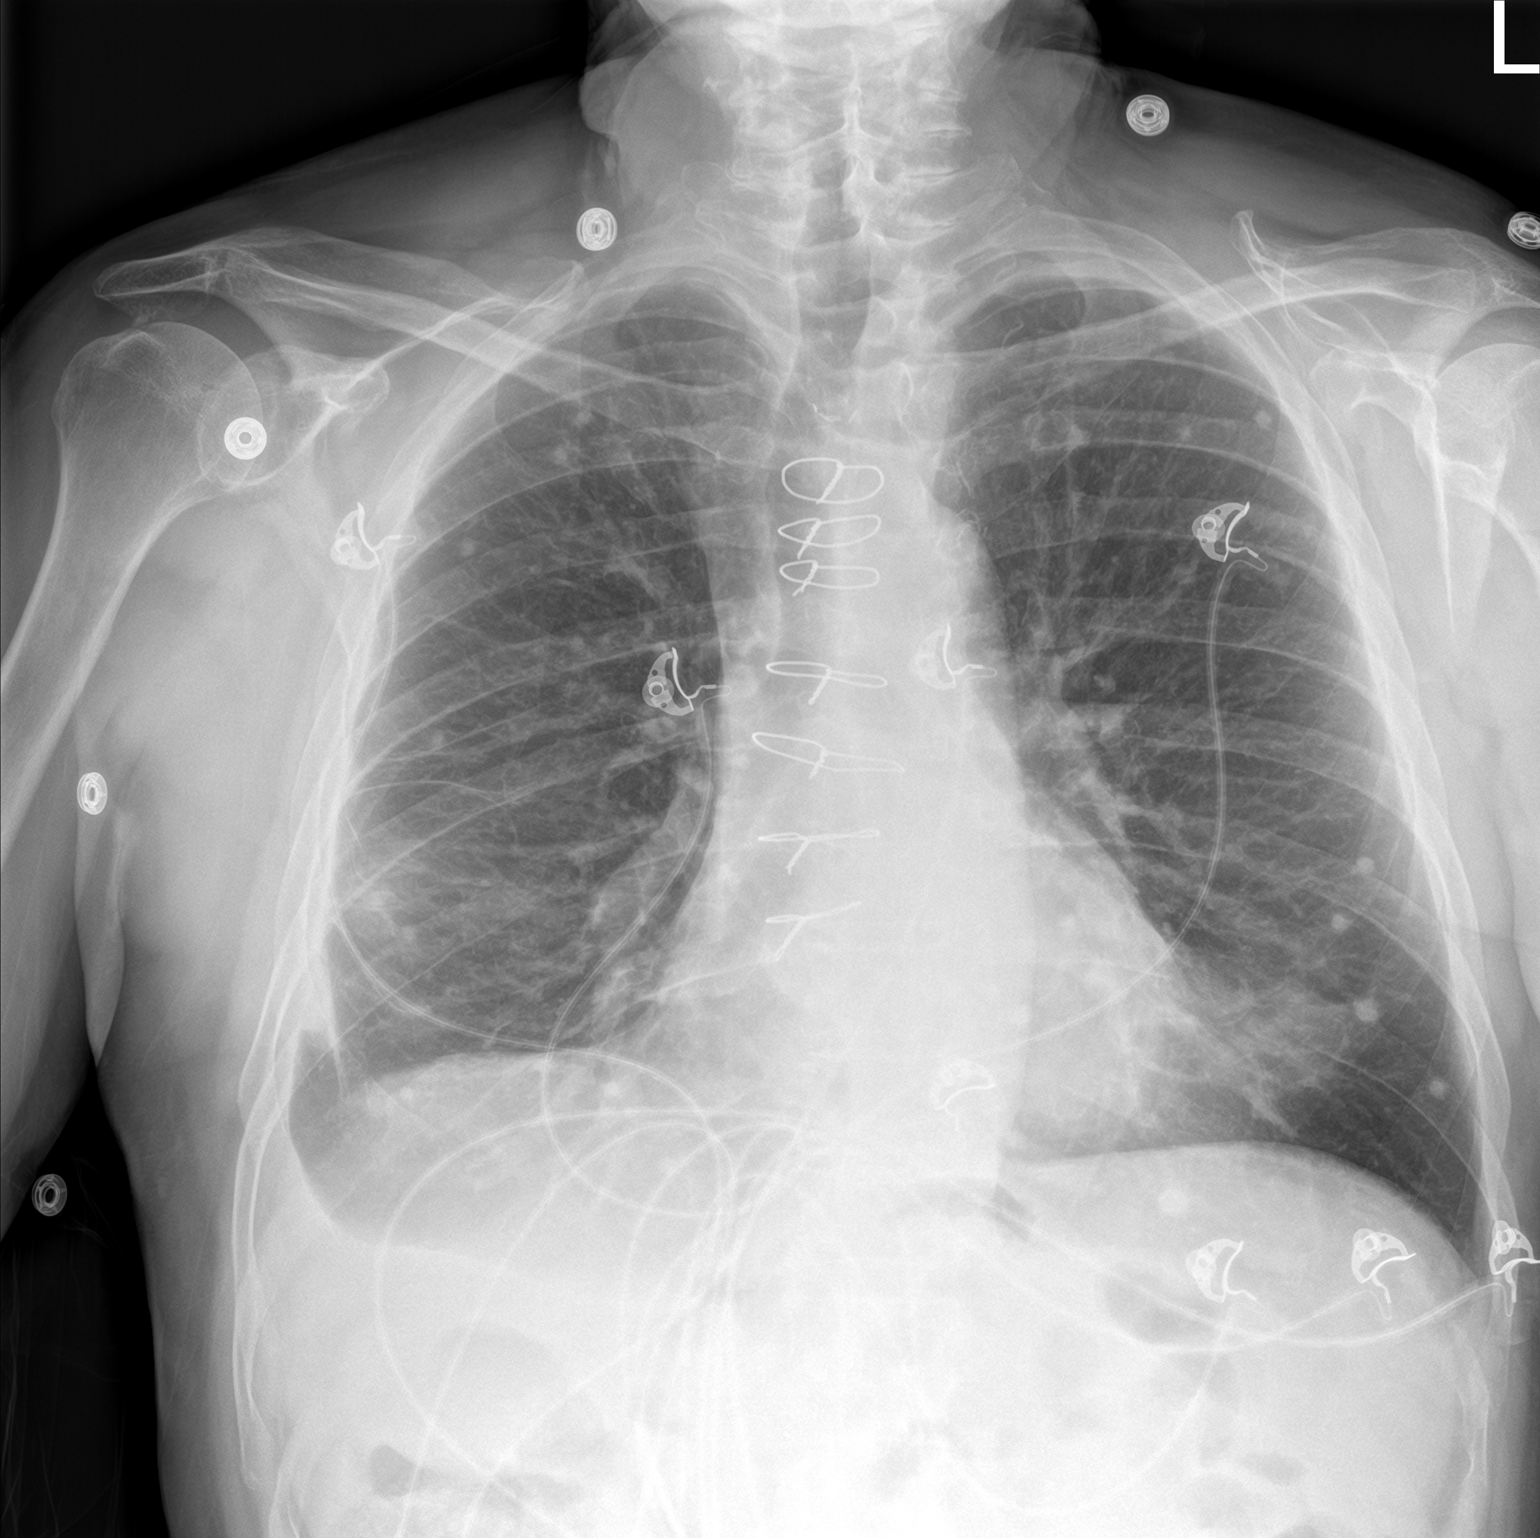

[chest lat]
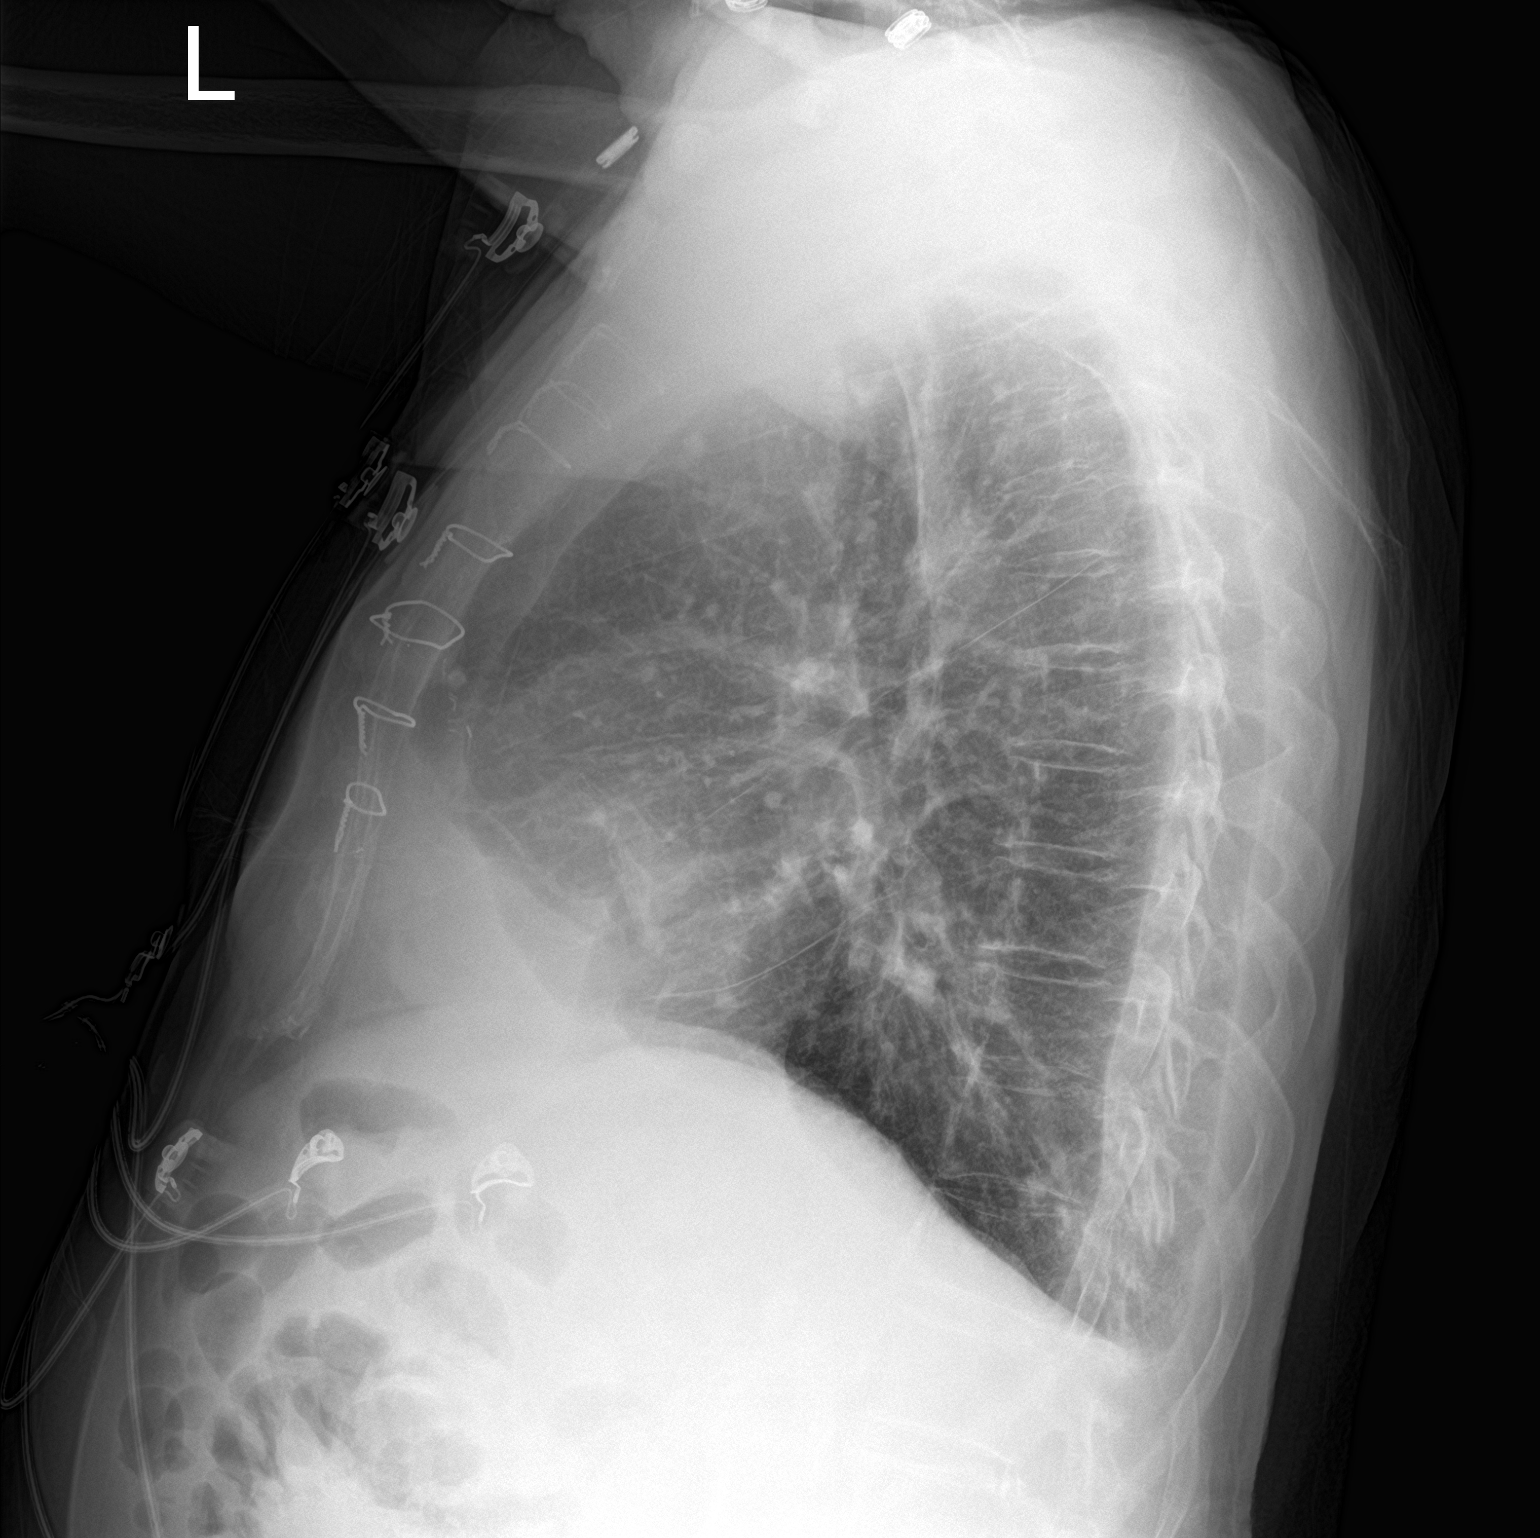

[2 of 2 positions shown; findings below may reference images not displayed]

FINDINGS: The heart size and mediastinal contours are stable status post
median sternotomy. There is chronic pleural thickening on the right
and calcified pulmonary granulomas bilaterally. Mild scarring in the
lingula appears stable. No edema, confluent airspace opacity,
significant pleural effusion or pneumothorax identified. The bones
appear unchanged. Telemetry leads overlie the chest.
IMPRESSION: Stable postoperative chest with chronic pleural thickening and
calcified pulmonary granulomas. No acute cardiopulmonary process.
